# Patient Record
Sex: Female | Born: 1990 | Race: Black or African American | Hispanic: No | Marital: Single | Smoking: Never smoker
Health system: Southern US, Community
[De-identification: ages and names within clinical notes are randomized; demographics above are authoritative.]

## PROBLEM LIST (undated history)

## (undated) ENCOUNTER — Emergency Department (HOSPITAL_COMMUNITY): Admission: EM

## (undated) ENCOUNTER — Inpatient Hospital Stay (HOSPITAL_COMMUNITY): Payer: Self-pay

## (undated) DIAGNOSIS — IMO0001 Reserved for inherently not codable concepts without codable children: Secondary | ICD-10-CM

## (undated) DIAGNOSIS — J302 Other seasonal allergic rhinitis: Secondary | ICD-10-CM

## (undated) HISTORY — DX: Reserved for inherently not codable concepts without codable children: IMO0001

## (undated) HISTORY — DX: Other seasonal allergic rhinitis: J30.2

---
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
## 2011-06-22 ENCOUNTER — Encounter (HOSPITAL_COMMUNITY): Payer: Self-pay | Admitting: *Deleted

## 2011-06-22 ENCOUNTER — Emergency Department (HOSPITAL_COMMUNITY)
Admission: EM | Admit: 2011-06-22 | Discharge: 2011-06-22 | Disposition: A | Discharge status: Home / Self Care | Payer: Self-pay | Attending: Emergency Medicine | Admitting: Emergency Medicine

## 2011-06-22 DIAGNOSIS — J029 Acute pharyngitis, unspecified: Secondary | ICD-10-CM | POA: Insufficient documentation

## 2011-06-22 DIAGNOSIS — J45909 Unspecified asthma, uncomplicated: Secondary | ICD-10-CM | POA: Insufficient documentation

## 2011-06-22 LAB — RAPID STREP SCREEN (MED CTR MEBANE ONLY): Streptococcus, Group A Screen (Direct): NEGATIVE

## 2011-06-22 MED ORDER — ACETAMINOPHEN-CODEINE 120-12 MG/5ML PO SOLN: 10.0000 mL | ORAL | Status: AC | PRN

## 2011-06-22 MED ORDER — AMOXICILLIN-POT CLAVULANATE 875-125 MG PO TABS: 1.0000 | ORAL_TABLET | Freq: Two times a day (BID) | ORAL | Status: AC

## 2011-06-22 NOTE — ED Notes (Signed)
Pt reports ear pain and pain with swallowing since Wednesday. Ibuprofen at home, fever this am.

## 2011-06-22 NOTE — ED Provider Notes (Signed)
History     CSN: [id number]  Arrival date & time 06/22/11  1532   First MD Initiated Contact with Patient 06/22/11 1720      Chief Complaint  Patient presents with  . Otalgia    (Consider location/radiation/quality/duration/timing/severity/associated sxs/prior treatment) HPI Patient presents emergency department with a three-day history of sore throat with pain that radiates to her left ear.  Patient denies nausea, vomiting, diarrhea, abdominal pain, neck swelling or fever.  Patient, states she does have pain with swallowing.  Patient denies take any medications prior to arrival.  Past Medical History  Diagnosis Date  . Asthma     controlled on symbicort, never hospitalized, never intubated  . Seasonal allergies     on allegra, nasonex  . Birth control     on tri-norinyl    History reviewed. No pertinent past surgical history.  Family History  Problem Relation Age of Onset  . Hypertension Mother   . Asthma    . Allergies      History  Substance Use Topics  . Smoking status: Never Smoker   . Smokeless tobacco: Never Used  . Alcohol Use: No    OB History    Grav Para Term Preterm Abortions TAB SAB Ect Mult Living                  Review of Systems All other systems negative except as documented in the HPI. All pertinent positives and negatives as reviewed in the HPI.  Allergies  Review of patient's allergies indicates no known allergies.  Home Medications   Current Outpatient Rx  Name Route Sig Dispense Refill  . ALBUTEROL SULFATE HFA 108 (90 BASE) MCG/ACT IN AERS Inhalation Inhale 2 puffs into the lungs every 6 (six) hours as needed. For shortness of breath    . ALBUTEROL SULFATE (5 MG/ML) 0.5% IN NEBU Nebulization Take 2.5 mg by nebulization every 6 (six) hours as needed. For shortness of breath    . BUDESONIDE-FORMOTEROL FUMARATE 160-4.5 MCG/ACT IN AERO Inhalation Inhale 2 puffs into the lungs 2 (two) times daily.      Marland Kitchen FEXOFENADINE-PSEUDOEPHED ER  180-240 MG PO TB24 Oral Take 1 tablet by mouth daily.    . MOMETASONE FUROATE 50 MCG/ACT NA SUSP Nasal Place 2 sprays into the nose daily.      Kathrynn Running ESTRAD TRIPHASIC 0.5/1/0.5-35 MG-MCG PO TABS Oral Take 1 tablet by mouth daily.        BP 129/76  Pulse 85  Temp 98.8 F (37.1 C)  Resp 20  SpO2 100%  LMP 06/10/2011  Physical Exam  Nursing note and vitals reviewed. Constitutional: She appears well-developed and well-nourished.  HENT:  Head: No trismus in the jaw.  Right Ear: Tympanic membrane normal.  Left Ear: Tympanic membrane normal.  Nose: Nose normal.  Mouth/Throat: Uvula is midline and mucous membranes are normal. No uvula swelling. Oropharyngeal exudate and posterior oropharyngeal edema present. No tonsillar abscesses.  Eyes: Pupils are equal, round, and reactive to light.  Neck: Normal range of motion. Neck supple.  Cardiovascular: Normal rate, regular rhythm and normal heart sounds.   No murmur heard. Pulmonary/Chest: Effort normal and breath sounds normal. No respiratory distress. She has no wheezes.  Lymphadenopathy:    She has cervical adenopathy.  Skin: Skin is warm and dry. No rash noted.    ED Course  Procedures (including critical care time)  Patient will be treated for exudative pharyngitis. The patient is advised to return here as needed. Follow  up with her doctor. Increase her fluids.  MDM          Carlyle Dolly, PA-C 06/22/11 1824

## 2011-06-22 NOTE — ED Provider Notes (Signed)
Medical screening examination/treatment/procedure(s) were performed by non-physician practitioner and as supervising physician I was immediately available for consultation/collaboration.   Dayton Bailiff, MD 06/22/11 [phone]

## 2011-06-22 NOTE — Discharge Instructions (Signed)
Return here as needed. Follow up with your doctor. Increase your fluid intake. °

## 2011-11-26 ENCOUNTER — Other Ambulatory Visit: Payer: Self-pay | Admitting: *Deleted

## 2011-11-27 MED ORDER — ALBUTEROL SULFATE HFA 108 (90 BASE) MCG/ACT IN AERS: 2.0000 | INHALATION_SPRAY | Freq: Four times a day (QID) | RESPIRATORY_TRACT | Status: DC | PRN

## 2011-11-27 NOTE — Telephone Encounter (Signed)
I will refill this but she needs to be seen soon in clinic.   Thanks,  Dr. Dorise Hiss

## 2011-11-27 NOTE — Telephone Encounter (Signed)
Sent flag to front desk pool to make appt per Dr Dorise Hiss.

## 2012-06-06 ENCOUNTER — Encounter: Payer: Self-pay | Admitting: Internal Medicine

## 2012-08-06 ENCOUNTER — Encounter (HOSPITAL_COMMUNITY): Payer: Self-pay | Admitting: Emergency Medicine

## 2012-08-06 ENCOUNTER — Emergency Department (HOSPITAL_COMMUNITY)
Admission: EM | Admit: 2012-08-06 | Discharge: 2012-08-06 | Disposition: A | Discharge status: Home / Self Care | Payer: BC Managed Care – PPO | Attending: Emergency Medicine | Admitting: Emergency Medicine

## 2012-08-06 DIAGNOSIS — K0381 Cracked tooth: Secondary | ICD-10-CM | POA: Insufficient documentation

## 2012-08-06 DIAGNOSIS — Z79899 Other long term (current) drug therapy: Secondary | ICD-10-CM | POA: Insufficient documentation

## 2012-08-06 DIAGNOSIS — J45909 Unspecified asthma, uncomplicated: Secondary | ICD-10-CM | POA: Insufficient documentation

## 2012-08-06 DIAGNOSIS — IMO0002 Reserved for concepts with insufficient information to code with codable children: Secondary | ICD-10-CM | POA: Insufficient documentation

## 2012-08-06 MED ORDER — OXYCODONE-ACETAMINOPHEN 5-325 MG PO TABS: 2.0000 | ORAL_TABLET | Freq: Four times a day (QID) | ORAL | Status: DC | PRN

## 2012-08-06 MED ORDER — PROMETHAZINE HCL 25 MG PO TABS: 25.0000 mg | ORAL_TABLET | Freq: Four times a day (QID) | ORAL | Status: DC | PRN

## 2012-08-06 NOTE — ED Notes (Signed)
Pt c/o left lower dental pain after cracking tooth this am

## 2012-08-06 NOTE — ED Provider Notes (Signed)
Medical screening examination/treatment/procedure(s) were performed by non-physician practitioner and as supervising physician I was immediately available for consultation/collaboration.   Glynn Octave, MD 08/06/12 1610

## 2012-08-06 NOTE — ED Provider Notes (Signed)
History    CSN: [id number] Arrival date & time 08/06/12  1234  First MD Initiated Contact with Patient 08/06/12 1257     Chief Complaint  Patient presents with  . Dental Pain   (Consider location/radiation/quality/duration/timing/severity/associated sxs/prior Treatment) HPI Comments: 22 year old female who presents today after she broke her tooth earlier this morning eating cereal. She states she is waiting for her cereal to get soggy, but ate cereal too soon. She has had a prior root canal in this tooth in 2012 and has not had any issues since that time. She states her pain began to slowly increase as she has been in the emergency department. It is a pinching pain without radiation. She has not done anything to make her to feel better. She is not sensitive to heat or cold. The pain does not radiate. She denies shortness of breath, fever, chills, nausea, vomiting, abdominal pain, trismus.  The history is provided by the patient. No language interpreter was used.   Past Medical History  Diagnosis Date  . Asthma     controlled on symbicort, never hospitalized, never intubated  . Seasonal allergies     on allegra, nasonex  . Birth control     on tri-norinyl   History reviewed. No pertinent past surgical history. Family History  Problem Relation Age of Onset  . Hypertension Mother   . Asthma    . Allergies     History  Substance Use Topics  . Smoking status: Never Smoker   . Smokeless tobacco: Never Used  . Alcohol Use: No   OB History   Grav Para Term Preterm Abortions TAB SAB Ect Mult Living                 Review of Systems  Constitutional: Negative for fever and chills.  HENT: Positive for dental problem. Negative for sore throat and drooling.   Respiratory: Negative for shortness of breath.   Cardiovascular: Negative for chest pain.  Gastrointestinal: Negative for nausea, vomiting and abdominal pain.    Allergies  Review of patient's allergies indicates no known  allergies.  Home Medications   Current Outpatient Rx  Name  Route  Sig  Dispense  Refill  . albuterol (PROVENTIL HFA;VENTOLIN HFA) 108 (90 BASE) MCG/ACT inhaler   Inhalation   Inhale 2 puffs into the lungs every 6 (six) hours as needed. For shortness of breath   1 Inhaler   0   . albuterol (PROVENTIL) (5 MG/ML) 0.5% nebulizer solution   Nebulization   Take 2.5 mg by nebulization every 6 (six) hours as needed. For shortness of breath         . budesonide-formoterol (SYMBICORT) 160-4.5 MCG/ACT inhaler   Inhalation   Inhale 3 puffs into the lungs 3 (three) times daily.          . fexofenadine-pseudoephedrine (ALLEGRA-D 24) 180-240 MG per 24 hr tablet   Oral   Take 1 tablet by mouth daily.         . fluticasone (FLONASE) 50 MCG/ACT nasal spray   Nasal   Place 2 sprays into the nose daily.         . Norethindrone-Ethinyl Estradiol Triphasic (TRI-NORINYL, 28,) 0.5/1/0.5-35 MG-MCG tablet   Oral   Take 1 tablet by mouth daily.            BP 120/80  Pulse 72  Temp(Src) 98.3 F (36.8 C) (Oral)  Resp 18  Ht 5\' 3"  (1.6 m)  Wt 165 lb (74.844 kg)  BMI 29.24 kg/m2  SpO2 100% Physical Exam  Nursing note and vitals reviewed. Constitutional: She is oriented to person, place, and time. She appears well-developed and well-nourished. No distress.  HENT:  Head: Normocephalic and atraumatic.  Right Ear: External ear normal.  Left Ear: External ear normal.  Nose: Nose normal.  Mouth/Throat: Uvula is midline, oropharynx is clear and moist and mucous membranes are normal.    Fillings in most molars.  Lower right molar cracked with missing medial aspect of tooth. Missing filling.  No trismus, submental edema, tongue elevation  Eyes: Conjunctivae are normal.  Neck: Normal range of motion.  Cardiovascular: Normal rate, regular rhythm and normal heart sounds.   Pulmonary/Chest: Effort normal and breath sounds normal. No stridor. No respiratory distress. She has no wheezes. She  has no rales.  Abdominal: Soft. She exhibits no distension.  Musculoskeletal: Normal range of motion.  Neurological: She is alert and oriented to person, place, and time. She has normal strength.  Skin: Skin is warm and dry. She is not diaphoretic. No erythema.  Psychiatric: She has a normal mood and affect. Her behavior is normal.    ED Course  Procedures (including critical care time) Labs Reviewed - No data to display No results found. 1. Broken or cracked tooth, nontraumatic     MDM  Patient with broken tooth.  No gross abscess.  Exam unconcerning for Ludwig's angina or spread of infection.  Will treat with pain medicine.  Patient has a dentist and follow up appointment. Urged patient to call and be put on the cancellation list. Gave her referral to [facility].    Mora Bellman, PA-C 08/06/12 [phone]

## 2012-08-06 NOTE — ED Notes (Signed)
Patient states she was eating cereal this morning and cracked one of her teeth.   Patient states she is having no pain at this time.  Patient states she did call dentist and unable to get an appointment until August.

## 2014-08-19 ENCOUNTER — Emergency Department (HOSPITAL_COMMUNITY)
Admission: EM | Admit: 2014-08-19 | Discharge: 2014-08-19 | Disposition: A | Discharge status: Home / Self Care | Payer: 59 | Attending: Emergency Medicine | Admitting: Emergency Medicine

## 2014-08-19 ENCOUNTER — Encounter (HOSPITAL_COMMUNITY): Payer: Self-pay | Admitting: Emergency Medicine

## 2014-08-19 DIAGNOSIS — Z3202 Encounter for pregnancy test, result negative: Secondary | ICD-10-CM | POA: Diagnosis not present

## 2014-08-19 DIAGNOSIS — R112 Nausea with vomiting, unspecified: Secondary | ICD-10-CM | POA: Insufficient documentation

## 2014-08-19 DIAGNOSIS — Z7951 Long term (current) use of inhaled steroids: Secondary | ICD-10-CM | POA: Insufficient documentation

## 2014-08-19 DIAGNOSIS — J45909 Unspecified asthma, uncomplicated: Secondary | ICD-10-CM | POA: Diagnosis not present

## 2014-08-19 DIAGNOSIS — Z79899 Other long term (current) drug therapy: Secondary | ICD-10-CM | POA: Diagnosis not present

## 2014-08-19 DIAGNOSIS — Z79818 Long term (current) use of other agents affecting estrogen receptors and estrogen levels: Secondary | ICD-10-CM | POA: Insufficient documentation

## 2014-08-19 DIAGNOSIS — N390 Urinary tract infection, site not specified: Secondary | ICD-10-CM | POA: Diagnosis not present

## 2014-08-19 LAB — COMPREHENSIVE METABOLIC PANEL
ALT: 11 U/L — ABNORMAL LOW (ref 14–54)
AST: 16 U/L (ref 15–41)
Albumin: 3.3 g/dL — ABNORMAL LOW (ref 3.5–5.0)
Alkaline Phosphatase: 53 U/L (ref 38–126)
Anion gap: 6 (ref 5–15)
BUN: 13 mg/dL (ref 6–20)
CO2: 25 mmol/L (ref 22–32)
Calcium: 8.6 mg/dL — ABNORMAL LOW (ref 8.9–10.3)
Chloride: 106 mmol/L (ref 101–111)
Creatinine, Ser: 0.72 mg/dL (ref 0.44–1.00)
GFR calc Af Amer: >60 mL/min (ref >60)
GFR calc non Af Amer: >60 mL/min (ref >60)
Glucose, Bld: 109 mg/dL — ABNORMAL HIGH (ref 65–99)
Potassium: 3.9 mmol/L (ref 3.5–5.1)
Sodium: 137 mmol/L (ref 135–145)
Total Bilirubin: 0.3 mg/dL (ref 0.3–1.2)
Total Protein: 7.9 g/dL (ref 6.5–8.1)

## 2014-08-19 LAB — HCG, QUANTITATIVE, PREGNANCY: hCG, Beta Chain, Quant, S: <1 m[IU]/mL (ref <5)

## 2014-08-19 LAB — CBC
HCT: 38.1 % (ref 36.0–46.0)
Hemoglobin: 12.4 g/dL (ref 12.0–15.0)
MCH: 28.2 pg (ref 26.0–34.0)
MCHC: 32.5 g/dL (ref 30.0–36.0)
MCV: 86.6 fL (ref 78.0–100.0)
Platelets: 248 10*3/uL (ref 150–400)
RBC: 4.4 MIL/uL (ref 3.87–5.11)
RDW: 14.1 % (ref 11.5–15.5)
WBC: 8.3 10*3/uL (ref 4.0–10.5)

## 2014-08-19 LAB — URINALYSIS, ROUTINE W REFLEX MICROSCOPIC
Bilirubin Urine: NEGATIVE
Glucose, UA: NEGATIVE mg/dL
Hgb urine dipstick: NEGATIVE
Ketones, ur: NEGATIVE mg/dL
Leukocytes, UA: NEGATIVE
Nitrite: POSITIVE — AB
Protein, ur: NEGATIVE mg/dL
Specific Gravity, Urine: 1.009 (ref 1.005–1.030)
Urobilinogen, UA: 1 mg/dL (ref 0.0–1.0)
pH: 6.5 (ref 5.0–8.0)

## 2014-08-19 LAB — URINE MICROSCOPIC-ADD ON

## 2014-08-19 MED ORDER — CEPHALEXIN 500 MG PO CAPS
500.0000 mg | ORAL_CAPSULE | Freq: Four times a day (QID) | ORAL | Status: DC
Start: 2014-08-19 — End: 2015-09-29

## 2014-08-19 MED ORDER — ONDANSETRON HCL 4 MG PO TABS: 4.0000 mg | ORAL_TABLET | Freq: Four times a day (QID) | ORAL | Status: DC

## 2014-08-19 MED ORDER — ONDANSETRON 4 MG PO TBDP
4.0000 mg | ORAL_TABLET | Freq: Once | ORAL | Status: AC | PRN
  Administered 2014-08-19: 4 mg via ORAL
  Filled 2014-08-19: qty 1

## 2014-08-19 NOTE — Discharge Instructions (Signed)
Nausea and Vomiting °Nausea means you feel sick to your stomach. Throwing up (vomiting) is a reflex where stomach contents come out of your mouth. °HOME CARE  °· Take medicine as told by your doctor. °· Do not force yourself to eat. However, you do need to drink fluids. °· If you feel like eating, eat a normal diet as told by your doctor. °· Eat rice, wheat, potatoes, bread, lean meats, yogurt, fruits, and vegetables. °· Avoid high-fat foods. °· Drink enough fluids to keep your pee (urine) clear or pale yellow. °· Ask your doctor how to replace body fluid losses (rehydrate). Signs of body fluid loss (dehydration) include: °· Feeling very thirsty. °· Dry lips and mouth. °· Feeling dizzy. °· Dark pee. °· Peeing less than normal. °· Feeling confused. °· Fast breathing or heart rate. °GET HELP RIGHT AWAY IF:  °· You have blood in your throw up. °· You have black or bloody poop (stool). °· You have a bad headache or stiff neck. °· You feel confused. °· You have bad belly (abdominal) pain. °· You have chest pain or trouble breathing. °· You do not pee at least once every 8 hours. °· You have cold, clammy skin. °· You keep throwing up after 24 to 48 hours. °· You have a fever. °MAKE SURE YOU:  °· Understand these instructions. °· Will watch your condition. °· Will get help right away if you are not doing well or get worse. °Document Released: 06/20/2007 Document Revised: 03/26/2011 Document Reviewed: 06/02/2010 °ExitCare® Patient Information ©2015 [employer]. This information is not intended to replace advice given to you by your health care provider. Make sure you discuss any questions you have with your health care provider. ° °Urinary Tract Infection °Urinary tract infections (UTIs) can develop anywhere along your urinary tract. Your urinary tract is your body's drainage system for removing wastes and extra water. Your urinary tract includes two kidneys, two ureters, a bladder, and a urethra. Your kidneys are a pair  of bean-shaped organs. Each kidney is about the size of your fist. They are located below your ribs, one on each side of your spine. °CAUSES °Infections are caused by microbes, which are microscopic organisms, including fungi, viruses, and bacteria. These organisms are so small that they can only be seen through a microscope. Bacteria are the microbes that most commonly cause UTIs. °SYMPTOMS  °Symptoms of UTIs may vary by age and gender of the patient and by the location of the infection. Symptoms in young women typically include a frequent and intense urge to urinate and a painful, burning feeling in the bladder or urethra during urination. Older women and men are more likely to be tired, shaky, and weak and have muscle aches and abdominal pain. A fever may mean the infection is in your kidneys. Other symptoms of a kidney infection include pain in your back or sides below the ribs, nausea, and vomiting. °DIAGNOSIS °To diagnose a UTI, your caregiver will ask you about your symptoms. Your caregiver also will ask to provide a urine sample. The urine sample will be tested for bacteria and white blood cells. White blood cells are made by your body to help fight infection. °TREATMENT  °Typically, UTIs can be treated with medication. Because most UTIs are caused by a bacterial infection, they usually can be treated with the use of antibiotics. The choice of antibiotic and length of treatment depend on your symptoms and the type of bacteria causing your infection. °HOME CARE INSTRUCTIONS °·   If you were prescribed antibiotics, take them exactly as your caregiver instructs you. Finish the medication even if you feel better after you have only taken some of the medication. °· Drink enough water and fluids to keep your urine clear or pale yellow. °· Avoid caffeine, tea, and carbonated beverages. They tend to irritate your bladder. °· Empty your bladder often. Avoid holding urine for long periods of time. °· Empty your bladder  before and after sexual intercourse. °· After a bowel movement, women should cleanse from front to back. Use each tissue only once. °SEEK MEDICAL CARE IF:  °· You have back pain. °· You develop a fever. °· Your symptoms do not begin to resolve within 3 days. °SEEK IMMEDIATE MEDICAL CARE IF:  °· You have severe back pain or lower abdominal pain. °· You develop chills. °· You have nausea or vomiting. °· You have continued burning or discomfort with urination. °MAKE SURE YOU:  °· Understand these instructions. °· Will watch your condition. °· Will get help right away if you are not doing well or get worse. °Document Released: 10/11/2004 Document Revised: 07/03/2011 Document Reviewed: 02/09/2011 °ExitCare® Patient Information ©2015 [employer]. This information is not intended to replace advice given to you by your health care provider. Make sure you discuss any questions you have with your health care provider. ° °

## 2014-08-19 NOTE — ED Notes (Signed)
Pt states she has had nausea since Thursday   Pt states she ate at [facility] and her cole slaw had mold in it  Pt states she has not had vomiting or diarrhea  Pt states she has not eaten anything on Wednesday but has been taking fluids

## 2014-08-19 NOTE — ED Provider Notes (Signed)
CSN: [id number]     Arrival date & time 08/19/14  0057 History   First MD Initiated Contact with Patient 08/19/14 0115     Chief Complaint  Patient presents with  . Nausea     (Consider location/radiation/quality/duration/timing/severity/associated sxs/prior Treatment) HPI Comments: The patient presents with c/o "I don't feel well". She has had nausea and vomiting x 8 in 3 days. No fever. No diarrhea. She feels she may have eaten food that was "moldy" and this is contributing to her symptoms. No other family members affected. She has cramping abdominal discomfort but no significant abdominal pain.   The history is provided by the patient. No language interpreter was used.    Past Medical History  Diagnosis Date  . Asthma     controlled on symbicort, never hospitalized, never intubated  . Seasonal allergies     on allegra, nasonex  . Birth control     on tri-norinyl   History reviewed. No pertinent past surgical history. Family History  Problem Relation Age of Onset  . Hypertension Mother   . Asthma    . Allergies     History  Substance Use Topics  . Smoking status: Never Smoker   . Smokeless tobacco: Never Used  . Alcohol Use: No   OB History    No data available     Review of Systems  Constitutional: Negative for fever and chills.  Gastrointestinal: Positive for nausea and vomiting. Negative for blood in stool.  Genitourinary: Negative.   Musculoskeletal: Negative.  Negative for myalgias.  Skin: Negative.   Neurological: Negative.       Allergies  Aleve and Ibuprofen  Home Medications   Prior to Admission medications   Medication Sig Start Date End Date Taking? Authorizing Provider  albuterol (PROVENTIL HFA;VENTOLIN HFA) 108 (90 BASE) MCG/ACT inhaler Inhale 2 puffs into the lungs every 6 (six) hours as needed. For shortness of breath 11/26/11   Judie Bonus, MD  albuterol (PROVENTIL) (5 MG/ML) 0.5% nebulizer solution Take 2.5 mg by nebulization every  6 (six) hours as needed. For shortness of breath    Historical Provider, MD  budesonide-formoterol (SYMBICORT) 160-4.5 MCG/ACT inhaler Inhale 3 puffs into the lungs 3 (three) times daily.     Historical Provider, MD  fexofenadine-pseudoephedrine (ALLEGRA-D 24) 180-240 MG per 24 hr tablet Take 1 tablet by mouth daily.    Historical Provider, MD  fluticasone (FLONASE) 50 MCG/ACT nasal spray Place 2 sprays into the nose daily.    Historical Provider, MD  Norethindrone-Ethinyl Estradiol Triphasic (TRI-NORINYL, 28,) 0.5/1/0.5-35 MG-MCG tablet Take 1 tablet by mouth daily.      Historical Provider, MD  oxyCODONE-acetaminophen (PERCOCET/ROXICET) 5-325 MG per tablet Take 2 tablets by mouth every 6 (six) hours as needed for pain. 08/06/12   Junious Silk, PA-C  promethazine (PHENERGAN) 25 MG tablet Take 1 tablet (25 mg total) by mouth every 6 (six) hours as needed for nausea. 08/06/12   Junious Silk, PA-C   BP 138/75 mmHg  Pulse 77  Temp(Src) 98.8 F (37.1 C) (Oral)  Resp 18  Ht 5\' 3"  (1.6 m)  Wt 225 lb (102.059 kg)  BMI 39.87 kg/m2  SpO2 100%  LMP 08/11/2014 (Exact Date) Physical Exam  Constitutional: She is oriented to person, place, and time. She appears well-developed and well-nourished.  Smiling, in NAD.  HENT:  Head: Normocephalic.  Mouth/Throat: Mucous membranes are dry.  Neck: Normal range of motion. Neck supple.  Cardiovascular: Normal rate and regular rhythm.   Pulmonary/Chest:  Effort normal and breath sounds normal. She has no wheezes. She has no rales. She exhibits no tenderness.  Abdominal: Soft. Bowel sounds are normal. She exhibits no distension. There is no tenderness. There is no rebound and no guarding.  Musculoskeletal: Normal range of motion.  Neurological: She is alert and oriented to person, place, and time.  Skin: Skin is warm and dry. No rash noted.  Psychiatric: She has a normal mood and affect.    ED Course  Procedures (including critical care time) Labs  Review Labs Reviewed  COMPREHENSIVE METABOLIC PANEL - Abnormal; Notable for the following:    Glucose, Bld 109 (*)    Calcium 8.6 (*)    Albumin 3.3 (*)    ALT 11 (*)    All other components within normal limits  URINALYSIS, ROUTINE W REFLEX MICROSCOPIC (NOT AT [facility]) - Abnormal; Notable for the following:    APPearance CLOUDY (*)    Nitrite POSITIVE (*)    All other components within normal limits  URINE MICROSCOPIC-ADD ON - Abnormal; Notable for the following:    Squamous Epithelial / LPF MANY (*)    Bacteria, UA MANY (*)    All other components within normal limits  CBC  HCG, QUANTITATIVE, PREGNANCY    Imaging Review No results found.   EKG Interpretation None      MDM   Final diagnoses:  None    1. Nausea and vomiting 2. Nitrite positive urine  The patient has no vomiting in the emergency department. She is tolerating PO fluids without difficulty. She reports being hungry on re-evaluation.  She denies urinary symptoms but does have a nitrite positive urine. Will treat with abx and encourage PCP follow up.     Elpidio Anis, PA-C 08/19/14 [id number]  Marisa Severin, MD 08/19/14 0530

## 2014-12-30 ENCOUNTER — Emergency Department (HOSPITAL_COMMUNITY): Payer: 59

## 2014-12-30 ENCOUNTER — Encounter (HOSPITAL_COMMUNITY): Payer: Self-pay | Admitting: Emergency Medicine

## 2014-12-30 ENCOUNTER — Emergency Department (HOSPITAL_COMMUNITY)
Admission: EM | Admit: 2014-12-30 | Discharge: 2014-12-30 | Disposition: A | Discharge status: Home / Self Care | Payer: 59 | Attending: Emergency Medicine | Admitting: Emergency Medicine

## 2014-12-30 DIAGNOSIS — Z793 Long term (current) use of hormonal contraceptives: Secondary | ICD-10-CM | POA: Diagnosis not present

## 2014-12-30 DIAGNOSIS — H9203 Otalgia, bilateral: Secondary | ICD-10-CM | POA: Diagnosis not present

## 2014-12-30 DIAGNOSIS — J45909 Unspecified asthma, uncomplicated: Secondary | ICD-10-CM | POA: Diagnosis not present

## 2014-12-30 DIAGNOSIS — Z7951 Long term (current) use of inhaled steroids: Secondary | ICD-10-CM | POA: Insufficient documentation

## 2014-12-30 DIAGNOSIS — K0889 Other specified disorders of teeth and supporting structures: Secondary | ICD-10-CM | POA: Diagnosis not present

## 2014-12-30 DIAGNOSIS — J069 Acute upper respiratory infection, unspecified: Secondary | ICD-10-CM | POA: Insufficient documentation

## 2014-12-30 DIAGNOSIS — Z792 Long term (current) use of antibiotics: Secondary | ICD-10-CM | POA: Diagnosis not present

## 2014-12-30 DIAGNOSIS — J029 Acute pharyngitis, unspecified: Secondary | ICD-10-CM | POA: Diagnosis present

## 2014-12-30 DIAGNOSIS — Z79899 Other long term (current) drug therapy: Secondary | ICD-10-CM | POA: Diagnosis not present

## 2014-12-30 MED ORDER — BENZONATATE 100 MG PO CAPS
100.0000 mg | ORAL_CAPSULE | Freq: Once | ORAL | Status: AC
  Administered 2014-12-30: 100 mg via ORAL
  Filled 2014-12-30: qty 1

## 2014-12-30 MED ORDER — BENZONATATE 100 MG PO CAPS: 100.0000 mg | ORAL_CAPSULE | Freq: Three times a day (TID) | ORAL | Status: DC

## 2014-12-30 NOTE — Progress Notes (Addendum)
Per pts friend. He stated,'she can not talk. She has a sore throat and is hoarse. She missed work yesterday and today and needs a work note. " Pt c/o of a bad tooth also. Pt does not appear to be in any distress. Tongue is not swollen and pt is not drooling. 3:45p Lung sounds clear in all fields. Pt taken for a CXR in the dept. 4p -returned from the x-ray dept. Tolerated well. Pt was given an ice pack to apply to her neck for slight discomfort.

## 2014-12-30 NOTE — Discharge Instructions (Signed)
1. Medications: tessalon, usual home medications 2. Treatment: rest, drink plenty of fluids 3. Follow Up: please followup with your primary doctor for discussion of your diagnoses and further evaluation after today's visit; if you do not have a primary care doctor use the resource guide provided to find one; please return to the ER for high fever, chest pain, shortness of breath, new or worsening symptoms   Upper Respiratory Infection, Adult Most upper respiratory infections (URIs) are a viral infection of the air passages leading to the lungs. A URI affects the nose, throat, and upper air passages. The most common type of URI is nasopharyngitis and is typically referred to as "the common cold." URIs run their course and usually go away on their own. Most of the time, a URI does not require medical attention, but sometimes a bacterial infection in the upper airways can follow a viral infection. This is called a secondary infection. Sinus and middle ear infections are common types of secondary upper respiratory infections. Bacterial pneumonia can also complicate a URI. A URI can worsen asthma and chronic obstructive pulmonary disease (COPD). Sometimes, these complications can require emergency medical care and may be life threatening.  CAUSES Almost all URIs are caused by viruses. A virus is a type of germ and can spread from one person to another.  RISKS FACTORS You may be at risk for a URI if:   You smoke.   You have chronic heart or lung disease.  You have a weakened defense (immune) system.   You are very young or very old.   You have nasal allergies or asthma.  You work in crowded or poorly ventilated areas.  You work in health care facilities or schools. SIGNS AND SYMPTOMS  Symptoms typically develop 2-3 days after you come in contact with a cold virus. Most viral URIs last 7-10 days. However, viral URIs from the influenza virus (flu virus) can last 14-18 days and are typically  more severe. Symptoms may include:   Runny or stuffy (congested) nose.   Sneezing.   Cough.   Sore throat.   Headache.   Fatigue.   Fever.   Loss of appetite.   Pain in your forehead, behind your eyes, and over your cheekbones (sinus pain).  Muscle aches.  DIAGNOSIS  Your health care provider may diagnose a URI by:  Physical exam.  Tests to check that your symptoms are not due to another condition such as:  Strep throat.  Sinusitis.  Pneumonia.  Asthma. TREATMENT  A URI goes away on its own with time. It cannot be cured with medicines, but medicines may be prescribed or recommended to relieve symptoms. Medicines may help:  Reduce your fever.  Reduce your cough.  Relieve nasal congestion. HOME CARE INSTRUCTIONS   Take medicines only as directed by your health care provider.   Gargle warm saltwater or take cough drops to comfort your throat as directed by your health care provider.  Use a warm mist humidifier or inhale steam from a shower to increase air moisture. This may make it easier to breathe.  Drink enough fluid to keep your urine clear or pale yellow.   Eat soups and other clear broths and maintain good nutrition.   Rest as needed.   Return to work when your temperature has returned to normal or as your health care provider advises. You may need to stay home longer to avoid infecting others. You can also use a face mask and careful hand washing to  prevent spread of the virus.  Increase the usage of your inhaler if you have asthma.   Do not use any tobacco products, including cigarettes, chewing tobacco, or electronic cigarettes. If you need help quitting, ask your health care provider. PREVENTION  The best way to protect yourself from getting a cold is to practice good hygiene.   Avoid oral or hand contact with people with cold symptoms.   Wash your hands often if contact occurs.  There is no clear evidence that vitamin C,  vitamin E, echinacea, or exercise reduces the chance of developing a cold. However, it is always recommended to get plenty of rest, exercise, and practice good nutrition.  SEEK MEDICAL CARE IF:   You are getting worse rather than better.   Your symptoms are not controlled by medicine.   You have chills.  You have worsening shortness of breath.  You have brown or red mucus.  You have yellow or brown nasal discharge.  You have pain in your face, especially when you bend forward.  You have a fever.  You have swollen neck glands.  You have pain while swallowing.  You have white areas in the back of your throat. SEEK IMMEDIATE MEDICAL CARE IF:   You have severe or persistent:  Headache.  Ear pain.  Sinus pain.  Chest pain.  You have chronic lung disease and any of the following:  Wheezing.  Prolonged cough.  Coughing up blood.  A change in your usual mucus.  You have a stiff neck.  You have changes in your:  Vision.  Hearing.  Thinking.  Mood. MAKE SURE YOU:   Understand these instructions.  Will watch your condition.  Will get help right away if you are not doing well or get worse.   This information is not intended to replace advice given to you by your health care provider. Make sure you discuss any questions you have with your health care provider.   Document Released: 06/27/2000 Document Revised: 05/18/2014 Document Reviewed: 04/08/2013 Elsevier Interactive Patient Education [employer]2016 Elsevier Inc.

## 2014-12-30 NOTE — ED Notes (Signed)
Pt began with a runny nose, cough, sore throat. Yesterday complained of occasional stomach pain, boyfriend says she vomited once. Patient says today her stomach no longer hurts, she does not feel nauseous. Only complaining of ear pain, runny nose, cough, sore throat, loss of voice.

## 2014-12-30 NOTE — ED Provider Notes (Signed)
CSN: [id number]     Arrival date & time 12/30/14  1514 History  By signing my name below, I, Joy Powers, attest that this documentation has been prepared under the direction and in the presence of Glean Hess, PA-C Electronically Signed: Charline Bills, ED Scribe 12/30/2014 at 4:19 PM.   Chief Complaint  Patient presents with  . Hoarse  . Nasal Congestion  . Cough  . Sore Throat    The history is provided by the patient. No language interpreter was used.    HPI Comments: Joy Powers is a 24 y.o. female, with a h/o asthma, who presents to the Emergency Department complaining of intermittent hoarseness since yesterday. Pt states that she has had nasal congestion and productive cough with green/yellow sputum for 3 weeks. She reports associated bilateral ear pain, intermittent left upper dental pain, and sore throat that has resolved. Pt has tried tylenol and her albuterol inhaler this morning with temporary relief. Pt also reports abdominal pain with 1 episode of emesis yesterday morning that has resolved. She denies SOB and chest pain.   Past Medical History  Diagnosis Date  . Asthma     controlled on symbicort, never hospitalized, never intubated  . Seasonal allergies     on allegra, nasonex  . Birth control     on tri-norinyl   History reviewed. No pertinent past surgical history. Family History  Problem Relation Age of Onset  . Hypertension Mother   . Asthma    . Allergies     Social History  Substance Use Topics  . Smoking status: Never Smoker   . Smokeless tobacco: Never Used  . Alcohol Use: No   OB History    No data available      Review of Systems  Constitutional: Negative for fever.  HENT: Positive for congestion, dental problem (resolved), ear pain, sore throat (resolved) and voice change.   Respiratory: Positive for cough. Negative for shortness of breath.   Cardiovascular: Negative for chest pain.  Gastrointestinal: Positive for vomiting  (resolved) and abdominal pain (resolved).    Allergies  Aleve and Ibuprofen  Home Medications   Prior to Admission medications   Medication Sig Start Date End Date Taking? Authorizing Provider  albuterol (PROVENTIL HFA;VENTOLIN HFA) 108 (90 BASE) MCG/ACT inhaler Inhale 2 puffs into the lungs every 6 (six) hours as needed. For shortness of breath 11/26/11   Myrlene Broker, MD  albuterol (PROVENTIL) (5 MG/ML) 0.5% nebulizer solution Take 2.5 mg by nebulization every 6 (six) hours as needed. For shortness of breath    Historical Provider, MD  benzonatate (TESSALON) 100 MG capsule Take 1 capsule (100 mg total) by mouth every 8 (eight) hours. 12/30/14   Mady Gemma, PA-C  budesonide-formoterol [facility]) 160-4.5 MCG/ACT inhaler Inhale 3 puffs into the lungs 3 (three) times daily.     Historical Provider, MD  cephALEXin (KEFLEX) 500 MG capsule Take 1 capsule (500 mg total) by mouth 4 (four) times daily. 08/19/14   Elpidio Anis, PA-C  fexofenadine-pseudoephedrine (ALLEGRA-D 24) 180-240 MG per 24 hr tablet Take 1 tablet by mouth daily.    Historical Provider, MD  fluticasone (FLONASE) 50 MCG/ACT nasal spray Place 2 sprays into the nose daily.    Historical Provider, MD  Norethindrone-Ethinyl Estradiol Triphasic (TRI-NORINYL, 28,) 0.5/1/0.5-35 MG-MCG tablet Take 1 tablet by mouth daily.      Historical Provider, MD  ondansetron (ZOFRAN) 4 MG tablet Take 1 tablet (4 mg total) by mouth every 6 (six) hours. 08/19/14  Elpidio AnisShari Upstill, PA-C  oxyCODONE-acetaminophen (PERCOCET/ROXICET) 5-325 MG per tablet Take 2 tablets by mouth every 6 (six) hours as needed for pain. 08/06/12   Junious SilkHannah Merrell, PA-C  promethazine (PHENERGAN) 25 MG tablet Take 1 tablet (25 mg total) by mouth every 6 (six) hours as needed for nausea. 08/06/12   Junious SilkHannah Merrell, PA-C    BP 124/75 mmHg  Pulse 76  Temp(Src) 98.1 F (36.7 C) (Oral)  Resp 18  SpO2 100% Physical Exam  Constitutional: She is oriented to person, place,  and time. She appears well-developed and well-nourished. No distress.  HENT:  Head: Normocephalic and atraumatic.  Right Ear: Hearing, tympanic membrane, external ear and ear canal normal.  Left Ear: Hearing, tympanic membrane, external ear and ear canal normal.  Nose: Nose normal.  Mouth/Throat: Uvula is midline, oropharynx is clear and moist and mucous membranes are normal. No oropharyngeal exudate, posterior oropharyngeal edema, posterior oropharyngeal erythema or tonsillar abscesses.  No TTP, edema, erythema, abscess to left upper teeth. No swelling to floor of mouth.  Eyes: Conjunctivae and EOM are normal. Pupils are equal, round, and reactive to light. Right eye exhibits no discharge. Left eye exhibits no discharge. No scleral icterus.  Neck: Normal range of motion. Neck supple.  Cardiovascular: Normal rate, regular rhythm, normal heart sounds and intact distal pulses.   Pulmonary/Chest: Effort normal and breath sounds normal. No respiratory distress. She has no wheezes. She has no rales.  Abdominal: Soft. Bowel sounds are normal. She exhibits no distension and no mass. There is no tenderness. There is no rebound and no guarding.  Musculoskeletal: Normal range of motion. She exhibits no edema or tenderness.  Neurological: She is alert and oriented to person, place, and time.  Skin: Skin is warm and dry. She is not diaphoretic.  Psychiatric: She has a normal mood and affect. Her behavior is normal.  Nursing note and vitals reviewed.  ED Course  Procedures (including critical care time)  DIAGNOSTIC STUDIES: Oxygen Saturation is 100% on RA, normal by my interpretation.    COORDINATION OF CARE: 3:41 PM-Discussed treatment plan which includes CXR and tessalon with pt at bedside and pt agreed to plan.   Labs Review Labs Reviewed - No data to display Imaging Review Dg Chest 2 View  12/30/2014  CLINICAL DATA:  Productive cough, fever. EXAM: CHEST  2 VIEW COMPARISON:  None. FINDINGS:  The heart size and mediastinal contours are within normal limits. Both lungs are clear. No pneumothorax or pleural effusion is noted. The visualized skeletal structures are unremarkable. IMPRESSION: No active cardiopulmonary disease. Electronically Signed   By: Lupita RaiderJames  Green Jr, M.D.   On: 12/30/2014 15:59   I have personally reviewed and evaluated these images as part of my medical decision-making.   EKG Interpretation None      MDM   Final diagnoses:  URI (upper respiratory infection)    24 year old female presents with ear pain, nasal congestion, productive cough, and hoarseness. States her symptoms have been present x 3 weeks. Also reports sore throat, now resolved, as well as abdominal pain yesterday and 1 episode of emesis. Reports subjective fever. Denies chest pain, shortness of breath.  Patient is afebrile. Vital signs stable. TMs clear bilaterally. No nasal congestion. Posterior oropharynx without erythema, edema, or exudate. No signs of dental infection or abscess. No evidence of ludwigs. Heart regular rate and rhythm. Lungs clear to auscultation bilaterally. Abdomen soft, nontender, nondistended.  Will obtain chest x-ray given chronicity of symptoms. Patient given tessalon for  cough. Chest x-ray negative for active cardiopulmonary disease.  Symptoms likely viral. Patient is nontoxic and well-appearing, feel she is stable for discharge at this time. Advised to use warm honey, tea, throat lozenges for symptom relief. Will discharge with tessalon. Patient to follow-up with PCP. Return precautions discussed. Patient verbalizes her understanding and is in agreement with plan.  BP 124/75 mmHg  Pulse 76  Temp(Src) 98.1 F (36.7 C) (Oral)  Resp 18  SpO2 100%  LMP 12/09/2014  I personally performed the services described in this documentation, which was scribed in my presence. The recorded information has been reviewed and is accurate.     Mady Gemma, PA-C 12/30/14  1619  Eber Hong, MD 01/02/15 [phone]

## 2015-01-17 ENCOUNTER — Emergency Department (HOSPITAL_COMMUNITY): Payer: 59

## 2015-01-17 ENCOUNTER — Encounter (HOSPITAL_COMMUNITY): Payer: Self-pay | Admitting: Emergency Medicine

## 2015-01-17 ENCOUNTER — Emergency Department (HOSPITAL_COMMUNITY)
Admission: EM | Admit: 2015-01-17 | Discharge: 2015-01-17 | Disposition: A | Discharge status: Home / Self Care | Payer: 59 | Attending: Emergency Medicine | Admitting: Emergency Medicine

## 2015-01-17 DIAGNOSIS — R05 Cough: Secondary | ICD-10-CM | POA: Diagnosis present

## 2015-01-17 DIAGNOSIS — Z79899 Other long term (current) drug therapy: Secondary | ICD-10-CM | POA: Diagnosis not present

## 2015-01-17 DIAGNOSIS — Z79818 Long term (current) use of other agents affecting estrogen receptors and estrogen levels: Secondary | ICD-10-CM | POA: Diagnosis not present

## 2015-01-17 DIAGNOSIS — Z792 Long term (current) use of antibiotics: Secondary | ICD-10-CM | POA: Diagnosis not present

## 2015-01-17 DIAGNOSIS — J0110 Acute frontal sinusitis, unspecified: Secondary | ICD-10-CM | POA: Diagnosis not present

## 2015-01-17 DIAGNOSIS — R059 Cough, unspecified: Secondary | ICD-10-CM

## 2015-01-17 DIAGNOSIS — Z7951 Long term (current) use of inhaled steroids: Secondary | ICD-10-CM | POA: Diagnosis not present

## 2015-01-17 DIAGNOSIS — J45909 Unspecified asthma, uncomplicated: Secondary | ICD-10-CM | POA: Diagnosis not present

## 2015-01-17 LAB — RAPID STREP SCREEN (MED CTR MEBANE ONLY): Streptococcus, Group A Screen (Direct): NEGATIVE

## 2015-01-17 MED ORDER — AMOXICILLIN-POT CLAVULANATE 875-125 MG PO TABS: 1.0000 | ORAL_TABLET | Freq: Two times a day (BID) | ORAL | Status: DC

## 2015-01-17 NOTE — ED Provider Notes (Signed)
CSN: [id number]     Arrival date & time 01/17/15  0914 History   First MD Initiated [id number] with Patient 01/17/15 1012     Chief Complaint  Patient presents with  . URI     (Consider location/radiation/quality/duration/timing/severity/associated sxs/prior Treatment) Patient is a 25 y.o. female presenting with URI. The history is provided by the patient and medical records. No language interpreter was used.  URI Presenting symptoms: congestion, cough and sore throat   Presenting symptoms: no fever   Associated symptoms: no wheezing    Joy Powers is a 25 y.o. female  with a PMH of asthma, seasonal allergies presents to the Emergency Department complaining of acute worsening of sinus pain, nasal congestion, and sore throat x 2 days. Patient states she was seen in ED on 12/15, noticed improvement over the last week/week-and-a-half, then acutely worsened. Denies fever at home, sob, difficulty swallowing, chest pain. Tried nebulizer albuterol tx with little relief.    Past Medical History  Diagnosis Date  . Asthma     controlled on symbicort, never hospitalized, never intubated  . Seasonal allergies     on allegra, nasonex  . Birth control     on tri-norinyl   History reviewed. No pertinent past surgical history. Family History  Problem Relation Age of Onset  . Hypertension Mother   . Asthma    . Allergies     Social History  Substance Use Topics  . Smoking status: Never Smoker   . Smokeless tobacco: Never Used  . Alcohol Use: No   OB History    No data available     Review of Systems  Constitutional: Negative for fever and chills.  HENT: Positive for congestion, sinus pressure and sore throat. Negative for facial swelling and trouble swallowing.   Eyes: Negative for discharge.  Respiratory: Positive for cough. Negative for choking, shortness of breath, wheezing and stridor.   Cardiovascular: Negative for chest pain and palpitations.  Gastrointestinal: Negative for  abdominal pain.  Skin: Negative for rash.      Allergies  Aleve and Ibuprofen  Home Medications   Prior to Admission medications   Medication Sig Start Date End Date Taking? Authorizing Provider  albuterol (PROVENTIL HFA;VENTOLIN HFA) 108 (90 BASE) MCG/ACT inhaler Inhale 2 puffs into the lungs every 6 (six) hours as needed. For shortness of breath 11/26/11   Myrlene BrokerElizabeth A Crawford, MD  albuterol (PROVENTIL) (5 MG/ML) 0.5% nebulizer solution Take 2.5 mg by nebulization every 6 (six) hours as needed. For shortness of breath    Historical Provider, MD  amoxicillin-clavulanate (AUGMENTIN) 875-125 MG tablet Take 1 tablet by mouth every 12 (twelve) hours. 01/17/15   Jud Fanguy Pilcher Kamon Fahr, PA-C  benzonatate (TESSALON) 100 MG capsule Take 1 capsule (100 mg total) by mouth every 8 (eight) hours. 12/30/14   Mady GemmaElizabeth C Westfall, PA-C  budesonide-formoterol [facility](SYMBICORT) 160-4.5 MCG/ACT inhaler Inhale 3 puffs into the lungs 3 (three) times daily.     Historical Provider, MD  cephALEXin (KEFLEX) 500 MG capsule Take 1 capsule (500 mg total) by mouth 4 (four) times daily. 08/19/14   Elpidio AnisShari Upstill, PA-C  fexofenadine-pseudoephedrine (ALLEGRA-D 24) 180-240 MG per 24 hr tablet Take 1 tablet by mouth daily.    Historical Provider, MD  fluticasone (FLONASE) 50 MCG/ACT nasal spray Place 2 sprays into the nose daily.    Historical Provider, MD  Norethindrone-Ethinyl Estradiol Triphasic (TRI-NORINYL, 28,) 0.5/1/0.5-35 MG-MCG tablet Take 1 tablet by mouth daily.      Historical Provider, MD  ondansetron (  ZOFRAN) 4 MG tablet Take 1 tablet (4 mg total) by mouth every 6 (six) hours. 08/19/14   Elpidio Anis, PA-C  oxyCODONE-acetaminophen (PERCOCET/ROXICET) 5-325 MG per tablet Take 2 tablets by mouth every 6 (six) hours as needed for pain. 08/06/12   Junious Silk, PA-C  promethazine (PHENERGAN) 25 MG tablet Take 1 tablet (25 mg total) by mouth every 6 (six) hours as needed for nausea. 08/06/12   Junious Silk, PA-C   BP 127/74  mmHg  Pulse 92  Temp(Src) 98.5 F (36.9 C) (Oral)  Resp 16  SpO2 99%  LMP 01/17/2015 Physical Exam  Constitutional: She is oriented to person, place, and time. She appears well-developed and well-nourished.  Alert and in no acute distress  HENT:  Head: Normocephalic and atraumatic.  Right Ear: Tympanic membrane, external ear and ear canal normal.  Left Ear: Tympanic membrane, external ear and ear canal normal.  Nose: Mucosal edema and rhinorrhea present. Right sinus exhibits no maxillary sinus tenderness and no frontal sinus tenderness. Left sinus exhibits frontal sinus tenderness. Left sinus exhibits no maxillary sinus tenderness.  Mouth/Throat: Uvula is midline, oropharynx is clear and moist and mucous membranes are normal. No oropharyngeal exudate, posterior oropharyngeal edema or posterior oropharyngeal erythema.  Cardiovascular: Normal rate, regular rhythm, normal heart sounds and intact distal pulses.  Exam reveals no gallop and no friction rub.   No murmur heard. Pulmonary/Chest: Effort normal and breath sounds normal. No respiratory distress. She has no wheezes. She has no rales.  Abdominal: She exhibits no mass. There is no rebound and no guarding.  Abdomen soft, non-tender, non-distended Bowel sounds positive in all four quadrants  Musculoskeletal: She exhibits no edema.  Neurological: She is alert and oriented to person, place, and time.  Skin: Skin is warm and dry. No rash noted.  Psychiatric: She has a normal mood and affect. Her behavior is normal. Judgment and thought content normal.  Nursing note and vitals reviewed.   ED Course  Procedures (including critical care time) Labs Review Labs Reviewed  RAPID STREP SCREEN (NOT AT [facility])  CULTURE, GROUP A STREP    Imaging Review Dg Chest 2 View  01/17/2015  CLINICAL DATA:  Cough EXAM: CHEST  2 VIEW COMPARISON:  12/30/2014 FINDINGS: Lungs are under aerated and clear. Upper normal heart size. No pneumothorax or pleural  effusion. IMPRESSION: No active cardiopulmonary disease. Electronically Signed   By: Jolaine Click M.D.   On: 01/17/2015 11:29   I have personally reviewed and evaluated these images and lab results as part of my medical decision-making.   EKG Interpretation None      MDM   Final diagnoses:  Cough  Acute frontal sinusitis, recurrence not specified  Asthma, unspecified asthma severity, uncomplicated   Joy Powers presents with worsening cough/congestion/sore throat/sinus pressure.   Labs: Rapid strep negative  Imaging: CXR with no acute cardiopulm dz.   A&P: Sinusitis / URI  - Augmentin x 5 days  - Flonase  - Home neb tx as needed  - Mucinex  - Increase hydration  - PCP follow up   Chase Picket Natalyn Szymanowski, PA-C 01/17/15 [address] Kadeisha Betsch, PA-C 01/17/15 1235  Alvira Monday, MD 01/17/15 2316

## 2015-01-17 NOTE — ED Notes (Signed)
Bed: WA01 Expected date:  Expected time:  Means of arrival:  Comments: 

## 2015-01-17 NOTE — Discharge Instructions (Signed)
1. Medications: flonase, mucinex as needed, Please take all of your antibiotics until finished!, usual home medications 2. Treatment: rest, drink plenty of fluids, take tylenol or ibuprofen for fever control 3. Follow Up: Please follow up with your primary doctor in 3 days for discussion of your diagnoses and further evaluation after today's visit; Return to the ER for high fevers, difficulty breathing or other concerning symptoms

## 2015-01-17 NOTE — ED Notes (Signed)
Per pt, states sore throat, congestion and body aches since Christmas Eve

## 2015-01-19 LAB — CULTURE, GROUP A STREP

## 2015-04-15 ENCOUNTER — Emergency Department (HOSPITAL_COMMUNITY)
Admission: EM | Admit: 2015-04-15 | Discharge: 2015-04-15 | Disposition: A | Discharge status: Home / Self Care | Payer: BLUE CROSS/BLUE SHIELD | Attending: Emergency Medicine | Admitting: Emergency Medicine

## 2015-04-15 ENCOUNTER — Emergency Department (HOSPITAL_COMMUNITY): Payer: BLUE CROSS/BLUE SHIELD

## 2015-04-15 ENCOUNTER — Encounter (HOSPITAL_COMMUNITY): Payer: Self-pay | Admitting: Emergency Medicine

## 2015-04-15 DIAGNOSIS — Z79899 Other long term (current) drug therapy: Secondary | ICD-10-CM | POA: Insufficient documentation

## 2015-04-15 DIAGNOSIS — R0602 Shortness of breath: Secondary | ICD-10-CM | POA: Diagnosis present

## 2015-04-15 DIAGNOSIS — J45901 Unspecified asthma with (acute) exacerbation: Secondary | ICD-10-CM

## 2015-04-15 DIAGNOSIS — Z7951 Long term (current) use of inhaled steroids: Secondary | ICD-10-CM | POA: Insufficient documentation

## 2015-04-15 MED ORDER — PREDNISONE 20 MG PO TABS
60.0000 mg | ORAL_TABLET | Freq: Once | ORAL | Status: AC
  Administered 2015-04-15: 60 mg via ORAL
  Filled 2015-04-15: qty 3

## 2015-04-15 MED ORDER — BENZONATATE 100 MG PO CAPS: 100.0000 mg | ORAL_CAPSULE | Freq: Three times a day (TID) | ORAL | Status: DC

## 2015-04-15 MED ORDER — ACETAMINOPHEN 325 MG PO TABS
650.0000 mg | ORAL_TABLET | Freq: Once | ORAL | Status: AC
  Administered 2015-04-15: 650 mg via ORAL
  Filled 2015-04-15: qty 2

## 2015-04-15 MED ORDER — ALBUTEROL SULFATE (2.5 MG/3ML) 0.083% IN NEBU: 5.0000 mg | INHALATION_SOLUTION | Freq: Once | RESPIRATORY_TRACT | Status: DC

## 2015-04-15 MED ORDER — ONDANSETRON HCL 4 MG PO TABS: 4.0000 mg | ORAL_TABLET | Freq: Four times a day (QID) | ORAL | Status: DC

## 2015-04-15 MED ORDER — AZITHROMYCIN 250 MG PO TABS: 250.0000 mg | ORAL_TABLET | Freq: Every day | ORAL | Status: DC

## 2015-04-15 MED ORDER — PREDNISONE 20 MG PO TABS: 60.0000 mg | ORAL_TABLET | Freq: Every day | ORAL | Status: DC

## 2015-04-15 NOTE — ED Provider Notes (Signed)
CSN: [id number]     Arrival date & time 04/15/15  0121 History   First MD Initiated Contact with Patient 04/15/15 [phone]     Chief Complaint  Patient presents with  . Shortness of Breath   HPI   Lyan D Nesheim is a 25 y.o. female PMH significant for asthma, seasonal allergies presenting with a 2 day history of shortness of breath. She states her shortness of breath similar to previous asthma exacerbations, has been intermittent. She states the pollen has been bothering her and endorses sick contacts (she is a school bus driver). She endorses a recent upper respiratory infection as well. She endorses coughing with posttussive emesis. She denies fevers, chills, chest pain when she is not coughing, abdominal pain, nausea, change in bowel or bladder habits.  Past Medical History  Diagnosis Date  . Asthma     controlled on symbicort, never hospitalized, never intubated  . Seasonal allergies     on allegra, nasonex  . Birth control     on tri-norinyl   History reviewed. No pertinent past surgical history. Family History  Problem Relation Age of Onset  . Hypertension Mother   . Asthma    . Allergies     Social History  Substance Use Topics  . Smoking status: Never Smoker   . Smokeless tobacco: Never Used  . Alcohol Use: No   OB History    No data available     Review of Systems  Ten systems are reviewed and are negative for acute change except as noted in the HPI  Allergies  Aleve  Home Medications   Prior to Admission medications   Medication Sig Start Date End Date Taking? Authorizing Provider  acetaminophen (TYLENOL) 500 MG tablet Take 1,000 mg by mouth every 6 (six) hours as needed for mild pain.   Yes Historical Provider, MD  albuterol (PROVENTIL HFA;VENTOLIN HFA) 108 (90 BASE) MCG/ACT inhaler Inhale 2 puffs into the lungs every 6 (six) hours as needed. For shortness of breath 11/26/11  Yes Myrlene Broker, MD  albuterol (PROVENTIL) (5 MG/ML) 0.5% nebulizer  solution Take 2.5 mg by nebulization every 6 (six) hours as needed. For shortness of breath   Yes Historical Provider, MD  budesonide-formoterol (SYMBICORT) 160-4.5 MCG/ACT inhaler Inhale 3 puffs into the lungs 3 (three) times daily.    Yes Historical Provider, MD  fexofenadine-pseudoephedrine (ALLEGRA-D 24) 180-240 MG per 24 hr tablet Take 1 tablet by mouth every other day.    Yes Historical Provider, MD  fluticasone (FLONASE) 50 MCG/ACT nasal spray Place 2 sprays into the nose daily.   Yes Historical Provider, MD  amoxicillin-clavulanate (AUGMENTIN) 875-125 MG tablet Take 1 tablet by mouth every 12 (twelve) hours. Patient not taking: Reported on 04/15/2015 01/17/15   [facility], PA-C  benzonatate (TESSALON) 100 MG capsule Take 1 capsule (100 mg total) by mouth every 8 (eight) hours. Patient not taking: Reported on 04/15/2015 12/30/14   Mady Gemma, PA-C  cephALEXin (KEFLEX) 500 MG capsule Take 1 capsule (500 mg total) by mouth 4 (four) times daily. Patient not taking: Reported on 04/15/2015 08/19/14   Elpidio Anis, PA-C  ondansetron (ZOFRAN) 4 MG tablet Take 1 tablet (4 mg total) by mouth every 6 (six) hours. Patient not taking: Reported on 04/15/2015 08/19/14   Elpidio Anis, PA-C  oxyCODONE-acetaminophen (PERCOCET/ROXICET) 5-325 MG per tablet Take 2 tablets by mouth every 6 (six) hours as needed for pain. Patient not taking: Reported on 04/15/2015 08/06/12   Junious Silk,  PA-C  promethazine (PHENERGAN) 25 MG tablet Take 1 tablet (25 mg total) by mouth every 6 (six) hours as needed for nausea. Patient not taking: Reported on 04/15/2015 08/06/12   Junious SilkHannah Merrell, PA-C   BP 99/70 mmHg  Pulse 61  Temp(Src) 98.1 F (36.7 C) (Oral)  Resp 18  SpO2 100%  LMP 03/11/2015 (Exact Date) Physical Exam  Constitutional: She appears well-developed and well-nourished. No distress.  HENT:  Head: Normocephalic and atraumatic.  Mouth/Throat: Oropharynx is clear and moist. No oropharyngeal exudate.   Eyes: Conjunctivae are normal. Pupils are equal, round, and reactive to light. Right eye exhibits no discharge. Left eye exhibits no discharge. No scleral icterus.  Neck: No tracheal deviation present.  Cardiovascular: Normal rate, regular rhythm, normal heart sounds and intact distal pulses.  Exam reveals no gallop and no friction rub.   No murmur heard. Pulmonary/Chest: Effort normal and breath sounds normal. No respiratory distress. She has no wheezes. She has no rales. She exhibits no tenderness.  Abdominal: Soft. Bowel sounds are normal. She exhibits no distension and no mass. There is no tenderness. There is no rebound and no guarding.  Musculoskeletal: She exhibits no edema.  Lymphadenopathy:    She has no cervical adenopathy.  Neurological: She is alert. Coordination normal.  Skin: Skin is warm and dry. No rash noted. She is not diaphoretic. No erythema.  Psychiatric: She has a normal mood and affect. Her behavior is normal.  Nursing note and vitals reviewed.   ED Course  Procedures   Imaging Review Dg Chest 2 View  04/15/2015  CLINICAL DATA:  25 year old female with shortness of breath EXAM: CHEST  2 VIEW COMPARISON:  Chest radiograph dated 01/17/2015 FINDINGS: The heart size and mediastinal contours are within normal limits. Both lungs are clear. The visualized skeletal structures are unremarkable. IMPRESSION: No active cardiopulmonary disease. Electronically Signed   By: Elgie CollardArash  Radparvar M.D.   On: 04/15/2015 02:44   I have personally reviewed and evaluated these images and lab results as part of my medical decision-making.  MDM   Final diagnoses:  Asthma, unspecified asthma severity, with acute exacerbation   Patient nontoxic-appearing, vital signs stable. Patient is satting at 100% on room air and is not tachycardic. No current signs of respiratory distress. Lung exam improved after nebulizer treatment. Chest x-ray ordered prior to my evaluation was unremarkable.  Prednisone given in the ED and pt will bd dc with 5 day burst. Pt states they are breathing at baseline. Pt has been instructed to continue using prescribed medications and to speak with PCP about today's exacerbation.    Melton KrebsSamantha Nicole Doll Frazee, PA-C 04/15/15 [phone]  Azalia BilisKevin Campos, MD 04/15/15 [phone]

## 2015-04-15 NOTE — ED Notes (Signed)
Pt states she has asthma and got short of breath earlier tonight around 11 pm  Pt states she has a nebulizer at home and took two treatments prior to coming  Pt states when the nebulizer was done the shortness of breath came back  Pt states she had a similar episode yesterday morning  Pt states tonight she was coughing so hard it made her vomit

## 2015-04-15 NOTE — Discharge Instructions (Signed)
[name],  Nice meeting you! Please follow-up with your primary care provider. Return to the emergency department if you develop increased shortness of breath, chest pain, nausea/vomiting, or are unable to keep foods down. Feel better soon!  S. Lane HackerNicole Beacher Every, PA-C Asthma, Adult Asthma is a recurring condition in which the airways tighten and narrow. Asthma can make it difficult to breathe. It can cause coughing, wheezing, and shortness of breath. Asthma episodes, also called asthma attacks, range from minor to life-threatening. Asthma cannot be cured, but medicines and lifestyle changes can help control it. CAUSES Asthma is believed to be caused by inherited (genetic) and environmental factors, but its exact cause is unknown. Asthma may be triggered by allergens, lung infections, or irritants in the air. Asthma triggers are different for each person. Common triggers include:   Animal dander.  Dust mites.  Cockroaches.  Pollen from trees or grass.  Mold.  Smoke.  Air pollutants such as dust, household cleaners, hair sprays, aerosol sprays, paint fumes, strong chemicals, or strong odors.  Cold air, weather changes, and winds (which increase molds and pollens in the air).  Strong emotional expressions such as crying or laughing hard.  Stress.  Certain medicines (such as aspirin) or types of drugs (such as beta-blockers).  Sulfites in foods and drinks. Foods and drinks that may contain sulfites include dried fruit, potato chips, and sparkling grape juice.  Infections or inflammatory conditions such as the flu, a cold, or an inflammation of the nasal membranes (rhinitis).  Gastroesophageal reflux disease (GERD).  Exercise or strenuous activity. SYMPTOMS Symptoms may occur immediately after asthma is triggered or many hours later. Symptoms include:  Wheezing.  Excessive nighttime or early morning coughing.  Frequent or severe coughing with a common cold.  Chest  tightness.  Shortness of breath. DIAGNOSIS  The diagnosis of asthma is made by a review of your medical history and a physical exam. Tests may also be performed. These may include:  Lung function studies. These tests show how much air you breathe in and out.  Allergy tests.  Imaging tests such as X-rays. TREATMENT  Asthma cannot be cured, but it can usually be controlled. Treatment involves identifying and avoiding your asthma triggers. It also involves medicines. There are 2 classes of medicine used for asthma treatment:   Controller medicines. These prevent asthma symptoms from occurring. They are usually taken every day.  Reliever or rescue medicines. These quickly relieve asthma symptoms. They are used as needed and provide short-term relief. Your health care provider will help you create an asthma action plan. An asthma action plan is a written plan for managing and treating your asthma attacks. It includes a list of your asthma triggers and how they may be avoided. It also includes information on when medicines should be taken and when their dosage should be changed. An action plan may also involve the use of a device called a peak flow meter. A peak flow meter measures how well the lungs are working. It helps you monitor your condition. HOME CARE INSTRUCTIONS   Take medicines only as directed by your health care provider. Speak with your health care provider if you have questions about how or when to take the medicines.  Use a peak flow meter as directed by your health care provider. Record and keep track of readings.  Understand and use the action plan to help minimize or stop an asthma attack without needing to seek medical care.  Control your home environment  in the following ways to help prevent asthma attacks:  Do not smoke. Avoid being exposed to secondhand smoke.  Change your heating and air conditioning filter regularly.  Limit your use of fireplaces and wood  stoves.  Get rid of pests (such as roaches and mice) and their droppings.  Throw away plants if you see mold on them.  Clean your floors and dust regularly. Use unscented cleaning products.  Try to have someone else vacuum for you regularly. Stay out of rooms while they are being vacuumed and for a short while afterward. If you vacuum, use a dust mask from a hardware store, a double-layered or microfilter vacuum cleaner bag, or a vacuum cleaner with a HEPA filter.  Replace carpet with wood, tile, or vinyl flooring. Carpet can trap dander and dust.  Use allergy-proof pillows, mattress covers, and box spring covers.  Wash bed sheets and blankets every week in hot water and dry them in a dryer.  Use blankets that are made of polyester or cotton.  Clean bathrooms and kitchens with bleach. If possible, have someone repaint the walls in these rooms with mold-resistant paint. Keep out of the rooms that are being cleaned and painted.  Wash hands frequently. SEEK MEDICAL CARE IF:   You have wheezing, shortness of breath, or a cough even if taking medicine to prevent attacks.  The colored mucus you cough up (sputum) is thicker than usual.  Your sputum changes from clear or white to yellow, green, gray, or bloody.  You have any problems that may be related to the medicines you are taking (such as a rash, itching, swelling, or trouble breathing).  You are using a reliever medicine more than 2-3 times per week.  Your peak flow is still at 50-79% of your personal best after following your action plan for 1 hour.  You have a fever. SEEK IMMEDIATE MEDICAL CARE IF:   You seem to be getting worse and are unresponsive to treatment during an asthma attack.  You are short of breath even at rest.  You get short of breath when doing very little physical activity.  You have difficulty eating, drinking, or talking due to asthma symptoms.  You develop chest pain.  You develop a fast  heartbeat.  You have a bluish color to your lips or fingernails.  You are light-headed, dizzy, or faint.  Your peak flow is less than 50% of your personal best.   This information is not intended to replace advice given to you by your health care provider. Make sure you discuss any questions you have with your health care provider.   Document Released: 01/01/2005 Document Revised: 09/22/2014 Document Reviewed: 07/31/2012 Elsevier Interactive Patient Education [employer]

## 2015-07-16 ENCOUNTER — Emergency Department (HOSPITAL_COMMUNITY)
Admission: EM | Admit: 2015-07-16 | Discharge: 2015-07-16 | Disposition: A | Discharge status: Home / Self Care | Payer: BLUE CROSS/BLUE SHIELD | Attending: Emergency Medicine | Admitting: Emergency Medicine

## 2015-07-16 ENCOUNTER — Encounter (HOSPITAL_COMMUNITY): Payer: Self-pay | Admitting: Emergency Medicine

## 2015-07-16 DIAGNOSIS — Y92410 Unspecified street and highway as the place of occurrence of the external cause: Secondary | ICD-10-CM | POA: Insufficient documentation

## 2015-07-16 DIAGNOSIS — M542 Cervicalgia: Secondary | ICD-10-CM | POA: Insufficient documentation

## 2015-07-16 DIAGNOSIS — J45909 Unspecified asthma, uncomplicated: Secondary | ICD-10-CM | POA: Insufficient documentation

## 2015-07-16 DIAGNOSIS — Y9389 Activity, other specified: Secondary | ICD-10-CM | POA: Insufficient documentation

## 2015-07-16 DIAGNOSIS — Y999 Unspecified external cause status: Secondary | ICD-10-CM | POA: Insufficient documentation

## 2015-07-16 DIAGNOSIS — Z79899 Other long term (current) drug therapy: Secondary | ICD-10-CM | POA: Diagnosis not present

## 2015-07-16 MED ORDER — IBUPROFEN 200 MG PO TABS
600.0000 mg | ORAL_TABLET | Freq: Once | ORAL | Status: AC
  Administered 2015-07-16: 600 mg via ORAL
  Filled 2015-07-16: qty 3

## 2015-07-16 MED ORDER — METHOCARBAMOL 500 MG PO TABS: 500.0000 mg | ORAL_TABLET | Freq: Two times a day (BID) | ORAL | Status: DC
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
## 2015-09-29 ENCOUNTER — Emergency Department (HOSPITAL_COMMUNITY)
Admission: EM | Admit: 2015-09-29 | Discharge: 2015-09-30 | Disposition: A | Discharge status: Home / Self Care | Payer: 59 | Attending: Emergency Medicine | Admitting: Emergency Medicine

## 2015-09-29 ENCOUNTER — Encounter (HOSPITAL_COMMUNITY): Payer: Self-pay | Admitting: *Deleted

## 2015-09-29 DIAGNOSIS — R1030 Lower abdominal pain, unspecified: Secondary | ICD-10-CM

## 2015-09-29 DIAGNOSIS — Z79899 Other long term (current) drug therapy: Secondary | ICD-10-CM | POA: Insufficient documentation

## 2015-09-29 DIAGNOSIS — N939 Abnormal uterine and vaginal bleeding, unspecified: Secondary | ICD-10-CM | POA: Insufficient documentation

## 2015-09-29 DIAGNOSIS — J45909 Unspecified asthma, uncomplicated: Secondary | ICD-10-CM | POA: Insufficient documentation

## 2015-09-29 DIAGNOSIS — Z7951 Long term (current) use of inhaled steroids: Secondary | ICD-10-CM | POA: Insufficient documentation

## 2015-09-29 LAB — COMPREHENSIVE METABOLIC PANEL
ALT: 14 U/L (ref 14–54)
AST: 19 U/L (ref 15–41)
Albumin: 3.8 g/dL (ref 3.5–5.0)
Alkaline Phosphatase: 69 U/L (ref 38–126)
Anion gap: 6 (ref 5–15)
BUN: 10 mg/dL (ref 6–20)
CO2: 26 mmol/L (ref 22–32)
Calcium: 9.1 mg/dL (ref 8.9–10.3)
Chloride: 106 mmol/L (ref 101–111)
Creatinine, Ser: 0.89 mg/dL (ref 0.44–1.00)
GFR calc Af Amer: >60 mL/min (ref >60)
GFR calc non Af Amer: >60 mL/min (ref >60)
Glucose, Bld: 99 mg/dL (ref 65–99)
Potassium: 3.9 mmol/L (ref 3.5–5.1)
Sodium: 138 mmol/L (ref 135–145)
Total Bilirubin: 0.5 mg/dL (ref 0.3–1.2)
Total Protein: 8.2 g/dL — ABNORMAL HIGH (ref 6.5–8.1)

## 2015-09-29 LAB — CBC
HCT: 36.4 % (ref 36.0–46.0)
Hemoglobin: 12.1 g/dL (ref 12.0–15.0)
MCH: 27.4 pg (ref 26.0–34.0)
MCHC: 33.2 g/dL (ref 30.0–36.0)
MCV: 82.5 fL (ref 78.0–100.0)
Platelets: 252 10*3/uL (ref 150–400)
RBC: 4.41 MIL/uL (ref 3.87–5.11)
RDW: 14.1 % (ref 11.5–15.5)
WBC: 8.2 10*3/uL (ref 4.0–10.5)

## 2015-09-29 LAB — I-STAT BETA HCG BLOOD, ED (MC, WL, AP ONLY): I-stat hCG, quantitative: <5 m[IU]/mL (ref <5)

## 2015-09-29 LAB — LIPASE, BLOOD: Lipase: 29 U/L (ref 11–51)

## 2015-09-29 MED ORDER — ONDANSETRON 4 MG PO TBDP
4.0000 mg | ORAL_TABLET | Freq: Once | ORAL | Status: AC
  Administered 2015-09-30: 4 mg via ORAL
  Filled 2015-09-29: qty 1

## 2015-09-29 NOTE — ED Triage Notes (Signed)
Pt states that she has unprotected sex approx 10 days ago and took the Plan B; pt states that yesterday she began to have abd pain and cramping and vaginal bleeding; pt also c/o nausea

## 2015-09-29 NOTE — ED Provider Notes (Signed)
[facility]-EMERGENCY DEPT Provider Note   CSN: [id number] Arrival date & time: 09/29/15  1950  By signing my name below, I, Alyssa Grove, attest that this documentation has been prepared under the direction and in the presence of [place], [place]. Electronically Signed: Alyssa Grove, ED Scribe. 09/29/15. 11:54 PM.  History   Chief Complaint Chief Complaint  Patient presents with  . Abdominal Pain   The history is provided by the patient. No language interpreter was used.    HPI Comments: Joy Powers is a 25 y.o. female who presents to the Emergency Department complaining of gradual onset, constant abdominal pain and cramping onset one day. Pt had unprotected sex 10 days ago and took Plan B on 09/20/2015. Pt reports vaginal bleeding comparable to her period since yesterday. She reports associated nausea and vomiting x1. Denies dizziness, vaginal discharge, or lightheadedness. Denies BRBPR or black tarry stools.   Past Medical History:  Diagnosis Date  . Asthma    controlled on symbicort, never hospitalized, never intubated  . Birth control    on tri-norinyl  . Seasonal allergies    on allegra, nasonex    Patient Active Problem List   Diagnosis Date Noted  . Asthma 09/05/2010  . Seasonal allergies 09/05/2010  . Birth control 09/05/2010    History reviewed. No pertinent surgical history.  OB History    No data available       Home Medications    Prior to Admission medications   Medication Sig Start Date End Date Taking? Authorizing Provider  albuterol (PROVENTIL HFA;VENTOLIN HFA) 108 (90 BASE) MCG/ACT inhaler Inhale 2 puffs into the lungs every 6 (six) hours as needed. For shortness of breath 11/26/11  Yes Myrlene Broker, MD  albuterol (PROVENTIL) (5 MG/ML) 0.5% nebulizer solution Take 2.5 mg by nebulization every 6 (six) hours as needed for wheezing or shortness of breath.    Yes Historical Provider, MD  budesonide-formoterol (SYMBICORT) 160-4.5 MCG/ACT inhaler Inhale  2 puffs into the lungs 2 (two) times daily as needed (SOB).    Yes Historical Provider, MD  Ibuprofen (MIDOL) 200 MG CAPS Take 400 mg by mouth every 6 (six) hours as needed (cramping).   Yes Historical Provider, MD  phentermine (ADIPEX-P) 37.5 MG tablet Take 37.5 mg by mouth daily before breakfast.   Yes Historical Provider, MD  azithromycin (ZITHROMAX) 250 MG tablet Take 1 tablet (250 mg total) by mouth daily. Take first 2 tablets together, then 1 every day until finished. Patient not taking: Reported on 09/29/2015 04/15/15   Melton Krebs, PA-C  benzonatate (TESSALON) 100 MG capsule Take 1 capsule (100 mg total) by mouth every 8 (eight) hours. Patient not taking: Reported on 09/29/2015 04/15/15   Melton Krebs, PA-C  methocarbamol (ROBAXIN) 500 MG tablet Take 1 tablet (500 mg total) by mouth 2 (two) times daily. Patient not taking: Reported on 09/29/2015 07/16/15   Waylan Boga Law, PA-C  ondansetron (ZOFRAN) 4 MG tablet Take 1 tablet (4 mg total) by mouth every 6 (six) hours. Patient not taking: Reported on 09/29/2015 04/15/15   Melton Krebs, PA-C  predniSONE (DELTASONE) 20 MG tablet Take 3 tablets (60 mg total) by mouth daily. Patient not taking: Reported on 09/29/2015 04/15/15   Melton Krebs, PA-C  promethazine (PHENERGAN) 25 MG tablet Take 1 tablet (25 mg total) by mouth every 6 (six) hours as needed for nausea. Patient not taking: Reported on 09/29/2015 08/06/12   Junious Silk, PA-C    Family History Family  History  Problem Relation Age of Onset  . Hypertension Mother   . Asthma    . Allergies      Social History Social History  Substance Use Topics  . Smoking status: Never Smoker  . Smokeless tobacco: Never Used  . Alcohol use No     Allergies   Aleve [naproxen sodium]   Review of Systems Review of Systems  Constitutional: Negative for fever.  Gastrointestinal: Positive for abdominal pain, nausea and vomiting.  Genitourinary: Positive for  vaginal bleeding. Negative for vaginal discharge.  Neurological: Negative for dizziness and light-headedness.  All other systems reviewed and are negative.   Physical Exam Updated Vital Signs BP 111/67 (BP Location: Left Arm)   Pulse 79   Temp 98.4 F (36.9 C) (Oral)   Resp 18   LMP 09/14/2015   SpO2 100%   Physical Exam  Constitutional: She is oriented to person, place, and time.  HENT:  Right Ear: External ear normal.  Left Ear: External ear normal.  Nose: Nose normal.  Mouth/Throat: Oropharynx is clear and moist. No oropharyngeal exudate.  Eyes: Conjunctivae are normal.  Neck: Neck supple.  Cardiovascular: Normal rate, regular rhythm, normal heart sounds and intact distal pulses.   Pulmonary/Chest: Effort normal and breath sounds normal. No respiratory distress. She has no wheezes.  Abdominal: Soft. Bowel sounds are normal. She exhibits no distension. There is no tenderness. There is no rebound and no guarding.  No CVA tenderness  Genitourinary:  Genitourinary Comments: refuses  Musculoskeletal: She exhibits no edema.  Lymphadenopathy:    She has no cervical adenopathy.  Neurological: She is alert and oriented to person, place, and time. No cranial nerve deficit.  Skin: Skin is warm and dry.  Psychiatric: She has a normal mood and affect.  Nursing note and vitals reviewed.    ED Treatments / Results  DIAGNOSTIC STUDIES: Oxygen Saturation is 100% on RA, normal by my interpretation.    COORDINATION OF CARE: 11:50 PM Discussed treatment plan with pt at bedside which includes Zofran and pt agreed to plan.  Labs (all labs ordered are listed, but only abnormal results are displayed) Labs Reviewed  COMPREHENSIVE METABOLIC PANEL - Abnormal; Notable for the following:       Result Value   Total Protein 8.2 (*)    All other components within normal limits  LIPASE, BLOOD  CBC  I-STAT BETA HCG BLOOD, ED ([facility], [facility], [facility] ONLY)  GC/CHLAMYDIA PROBE AMP ([place]) NOT AT  [facility]    EKG  EKG Interpretation None       Radiology No results found.  Procedures Procedures (including critical care time)  Medications Ordered in ED Medications  ondansetron (ZOFRAN-ODT) disintegrating tablet 4 mg (not administered)     Initial Impression / Assessment and Plan / ED Course  I have reviewed the triage vital signs and the nursing notes.  Pertinent labs & imaging results that were available during my care of the patient were reviewed by me and considered in my medical decision making (see chart for details).  Clinical Course    Pt hemodynamically stable. Labs unrevealing. Abdominal exam benign. Pt refuses pelvic exam. She likely has bleeding/cramping from Plan B pill. Her nausea is improved with zofran PO. Tolerating PO now. No indication for further emergent imaging or workup. Encouraged close f/u with OB/GYN and PCP. Strict ER return precautions given.  Final Clinical Impressions(s) / ED Diagnoses   Final diagnoses:  Lower abdominal pain  Vaginal bleeding    New  Prescriptions Discharge Medication List as of 09/30/2015  1:15 AM     I personally performed the services described in this documentation, which was scribed in my presence. The recorded information has been reviewed and is accurate.    Carlene Coria, PA-C 09/30/15 Keturah Barre, MD 09/30/15 2251

## 2015-09-30 NOTE — Discharge Instructions (Signed)
Your labs today were normal. You declined pelvic exam. Your symptoms are likely due to the Plan B pill. Please follow up with your primary care provider or the women's outpatient clinic as soon as possible.

## 2015-12-15 ENCOUNTER — Other Ambulatory Visit: Payer: Self-pay | Admitting: Physician Assistant

## 2015-12-15 DIAGNOSIS — N941 Unspecified dyspareunia: Secondary | ICD-10-CM

## 2015-12-22 ENCOUNTER — Other Ambulatory Visit: Payer: 59

## 2016-01-02 ENCOUNTER — Ambulatory Visit
Admission: RE | Admit: 2016-01-02 | Discharge: 2016-01-02 | Disposition: A | Discharge status: Home / Self Care | Payer: BC Managed Care – PPO | Source: Ambulatory Visit | Attending: Physician Assistant | Admitting: Physician Assistant

## 2016-01-02 DIAGNOSIS — N941 Unspecified dyspareunia: Secondary | ICD-10-CM

## 2016-08-31 ENCOUNTER — Encounter (HOSPITAL_COMMUNITY): Payer: Self-pay | Admitting: Emergency Medicine

## 2016-08-31 DIAGNOSIS — B349 Viral infection, unspecified: Secondary | ICD-10-CM | POA: Insufficient documentation

## 2016-08-31 DIAGNOSIS — J029 Acute pharyngitis, unspecified: Secondary | ICD-10-CM | POA: Insufficient documentation

## 2016-08-31 DIAGNOSIS — J45909 Unspecified asthma, uncomplicated: Secondary | ICD-10-CM | POA: Diagnosis not present

## 2016-08-31 DIAGNOSIS — Z79899 Other long term (current) drug therapy: Secondary | ICD-10-CM | POA: Insufficient documentation

## 2016-08-31 MED ORDER — ACETAMINOPHEN 500 MG PO TABS
ORAL_TABLET | ORAL | Status: DC
Start: 2016-08-31 — End: 2016-09-01
  Filled 2016-08-31: qty 2

## 2016-08-31 MED ORDER — ACETAMINOPHEN 325 MG PO TABS
650.0000 mg | ORAL_TABLET | Freq: Once | ORAL | Status: AC | PRN
  Administered 2016-08-31: 650 mg via ORAL

## 2016-08-31 NOTE — ED Triage Notes (Signed)
Pt presents to ED for assessment of sore throat, blood shot eyes, cough with green sputum, nasal congestion, body aches and fever.

## 2016-09-01 ENCOUNTER — Emergency Department (HOSPITAL_COMMUNITY): Payer: BC Managed Care – PPO

## 2016-09-01 ENCOUNTER — Emergency Department (HOSPITAL_COMMUNITY)
Admission: EM | Admit: 2016-09-01 | Discharge: 2016-09-01 | Disposition: A | Discharge status: Home / Self Care | Payer: BC Managed Care – PPO | Attending: Emergency Medicine | Admitting: Emergency Medicine

## 2016-09-01 DIAGNOSIS — J029 Acute pharyngitis, unspecified: Secondary | ICD-10-CM

## 2016-09-01 LAB — CBC WITH DIFFERENTIAL/PLATELET
Basophils Absolute: 0 10*3/uL (ref 0.0–0.1)
Basophils Relative: 0 %
Eosinophils Absolute: 0.1 10*3/uL (ref 0.0–0.7)
Eosinophils Relative: 1 %
HCT: 36.3 % (ref 36.0–46.0)
Hemoglobin: 12.1 g/dL (ref 12.0–15.0)
Lymphocytes Relative: 18 %
Lymphs Abs: 1.4 10*3/uL (ref 0.7–4.0)
MCH: 27.5 pg (ref 26.0–34.0)
MCHC: 33.3 g/dL (ref 30.0–36.0)
MCV: 82.5 fL (ref 78.0–100.0)
Monocytes Absolute: 0.8 10*3/uL (ref 0.1–1.0)
Monocytes Relative: 10 %
Neutro Abs: 5.7 10*3/uL (ref 1.7–7.7)
Neutrophils Relative %: 71 %
Platelets: 220 10*3/uL (ref 150–400)
RBC: 4.4 MIL/uL (ref 3.87–5.11)
RDW: 14.7 % (ref 11.5–15.5)
WBC: 8 10*3/uL (ref 4.0–10.5)

## 2016-09-01 LAB — I-STAT CHEM 8, ED
BUN: 10 mg/dL (ref 6–20)
Calcium, Ion: 1.04 mmol/L — ABNORMAL LOW (ref 1.15–1.40)
Chloride: 108 mmol/L (ref 101–111)
Creatinine, Ser: 0.8 mg/dL (ref 0.44–1.00)
Glucose, Bld: 100 mg/dL — ABNORMAL HIGH (ref 65–99)
HCT: 38 % (ref 36.0–46.0)
Hemoglobin: 12.9 g/dL (ref 12.0–15.0)
Potassium: 3.7 mmol/L (ref 3.5–5.1)
Sodium: 138 mmol/L (ref 135–145)
TCO2: 20 mmol/L (ref 0–100)

## 2016-09-01 LAB — RAPID STREP SCREEN (MED CTR MEBANE ONLY): Streptococcus, Group A Screen (Direct): NEGATIVE

## 2016-09-01 MED ORDER — ACETAMINOPHEN 500 MG PO TABS: 1000.0000 mg | ORAL_TABLET | Freq: Once | ORAL | Status: DC

## 2016-09-01 MED ORDER — SODIUM CHLORIDE 0.9 % IV BOLUS (SEPSIS)
1000.0000 mL | Freq: Once | INTRAVENOUS | Status: AC
  Administered 2016-09-01: 1000 mL via INTRAVENOUS

## 2016-09-01 MED ORDER — DEXAMETHASONE SODIUM PHOSPHATE 10 MG/ML IJ SOLN
10.0000 mg | Freq: Once | INTRAMUSCULAR | Status: AC
  Administered 2016-09-01: 10 mg via INTRAVENOUS
  Filled 2016-09-01: qty 1

## 2016-09-01 NOTE — Discharge Instructions (Signed)
Your strep test is negative. White count is sign of infection is normal without any elevation he was given IV fluids and Tylenol for your temperature and a medication called Decadron should decrease the swelling in his throat. Is no need for antibiotics as this is a virus that will not respond to antibiotics
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
## 2016-09-03 LAB — CULTURE, GROUP A STREP (THRC)

## 2016-09-06 IMAGING — CR DG CHEST 2V
2 series · 2 of 2 positions shown · non-contrast
Comparison: 12/30/2014

CLINICAL DATA: Cough

EXAM:
CHEST  2 VIEW

[w chest pa]
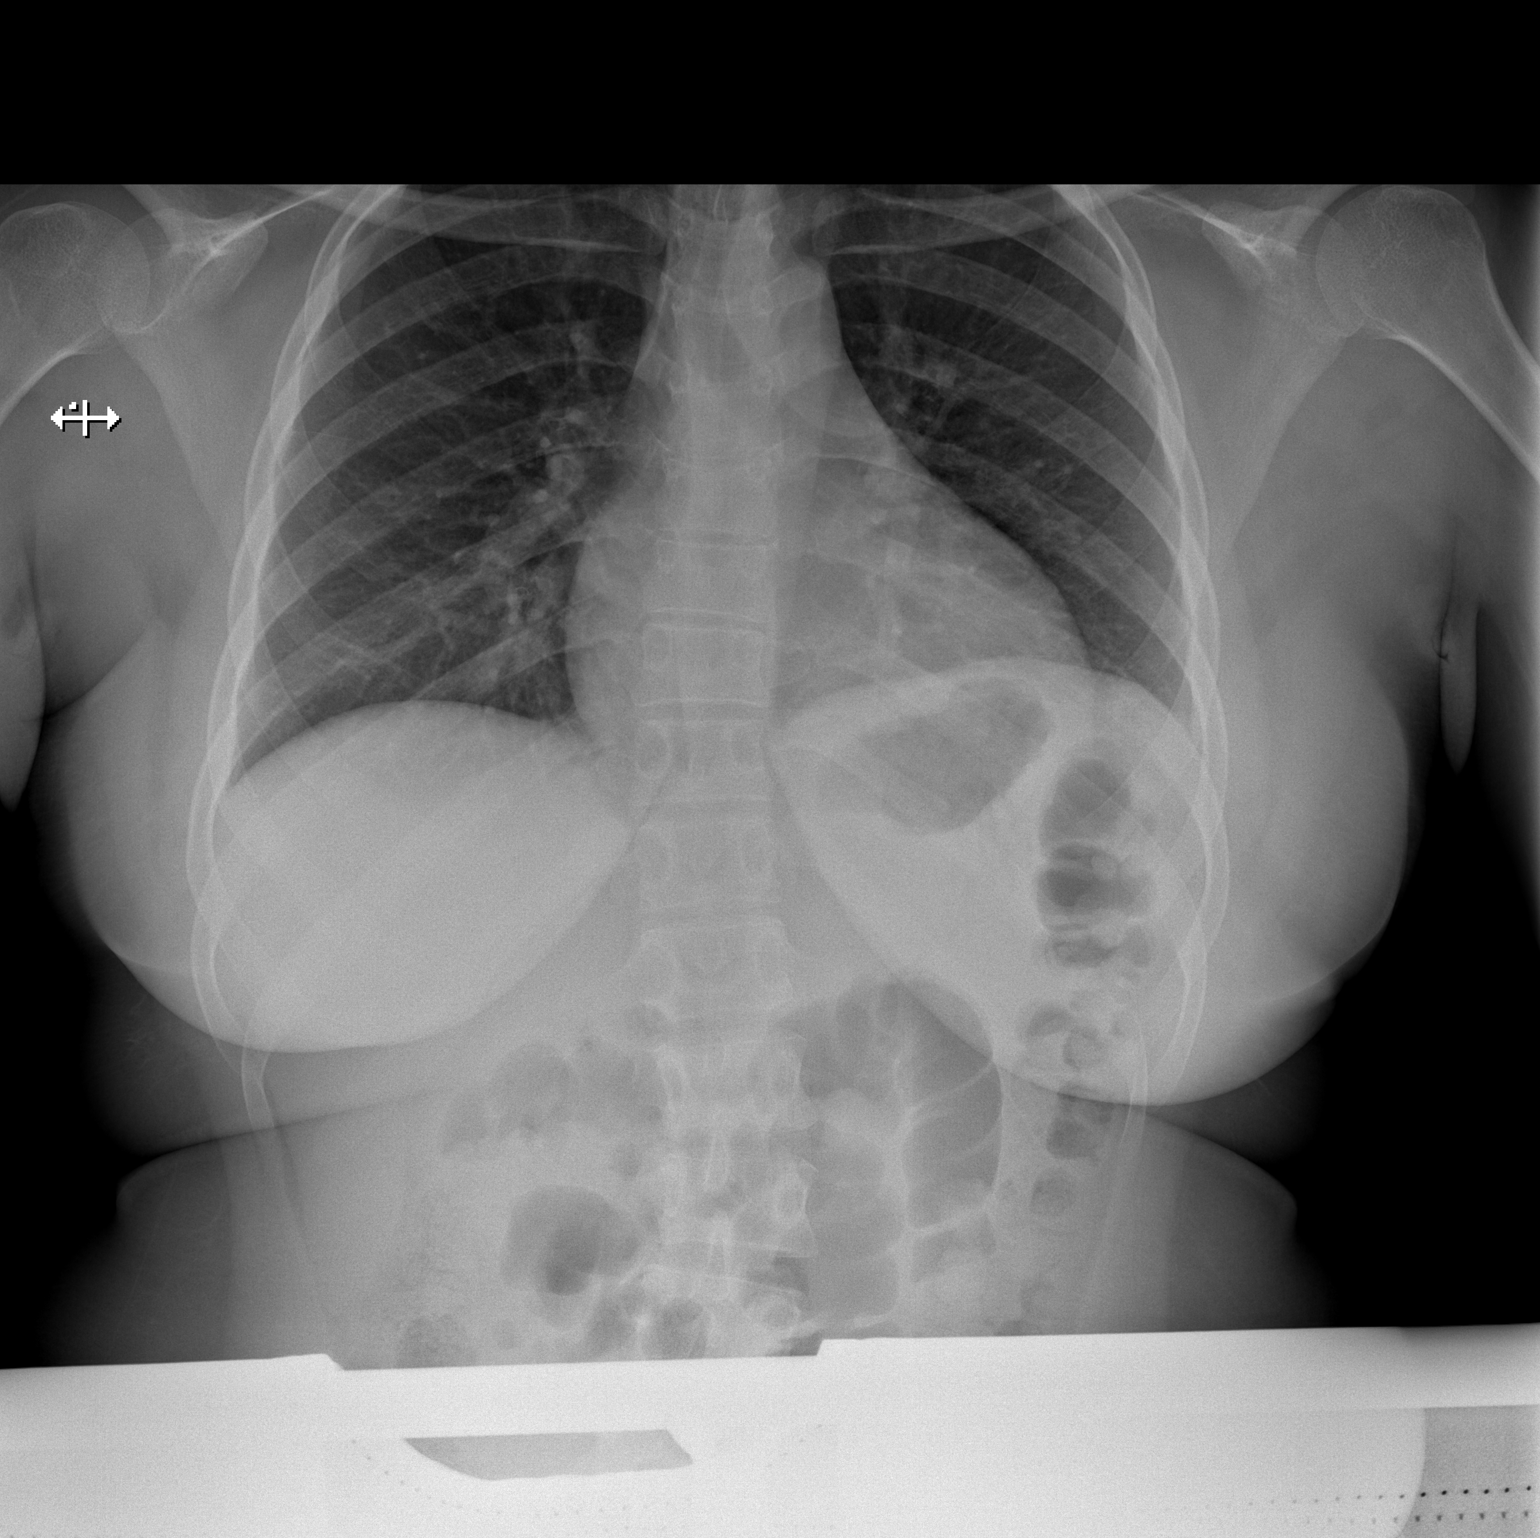

[w chest lat]
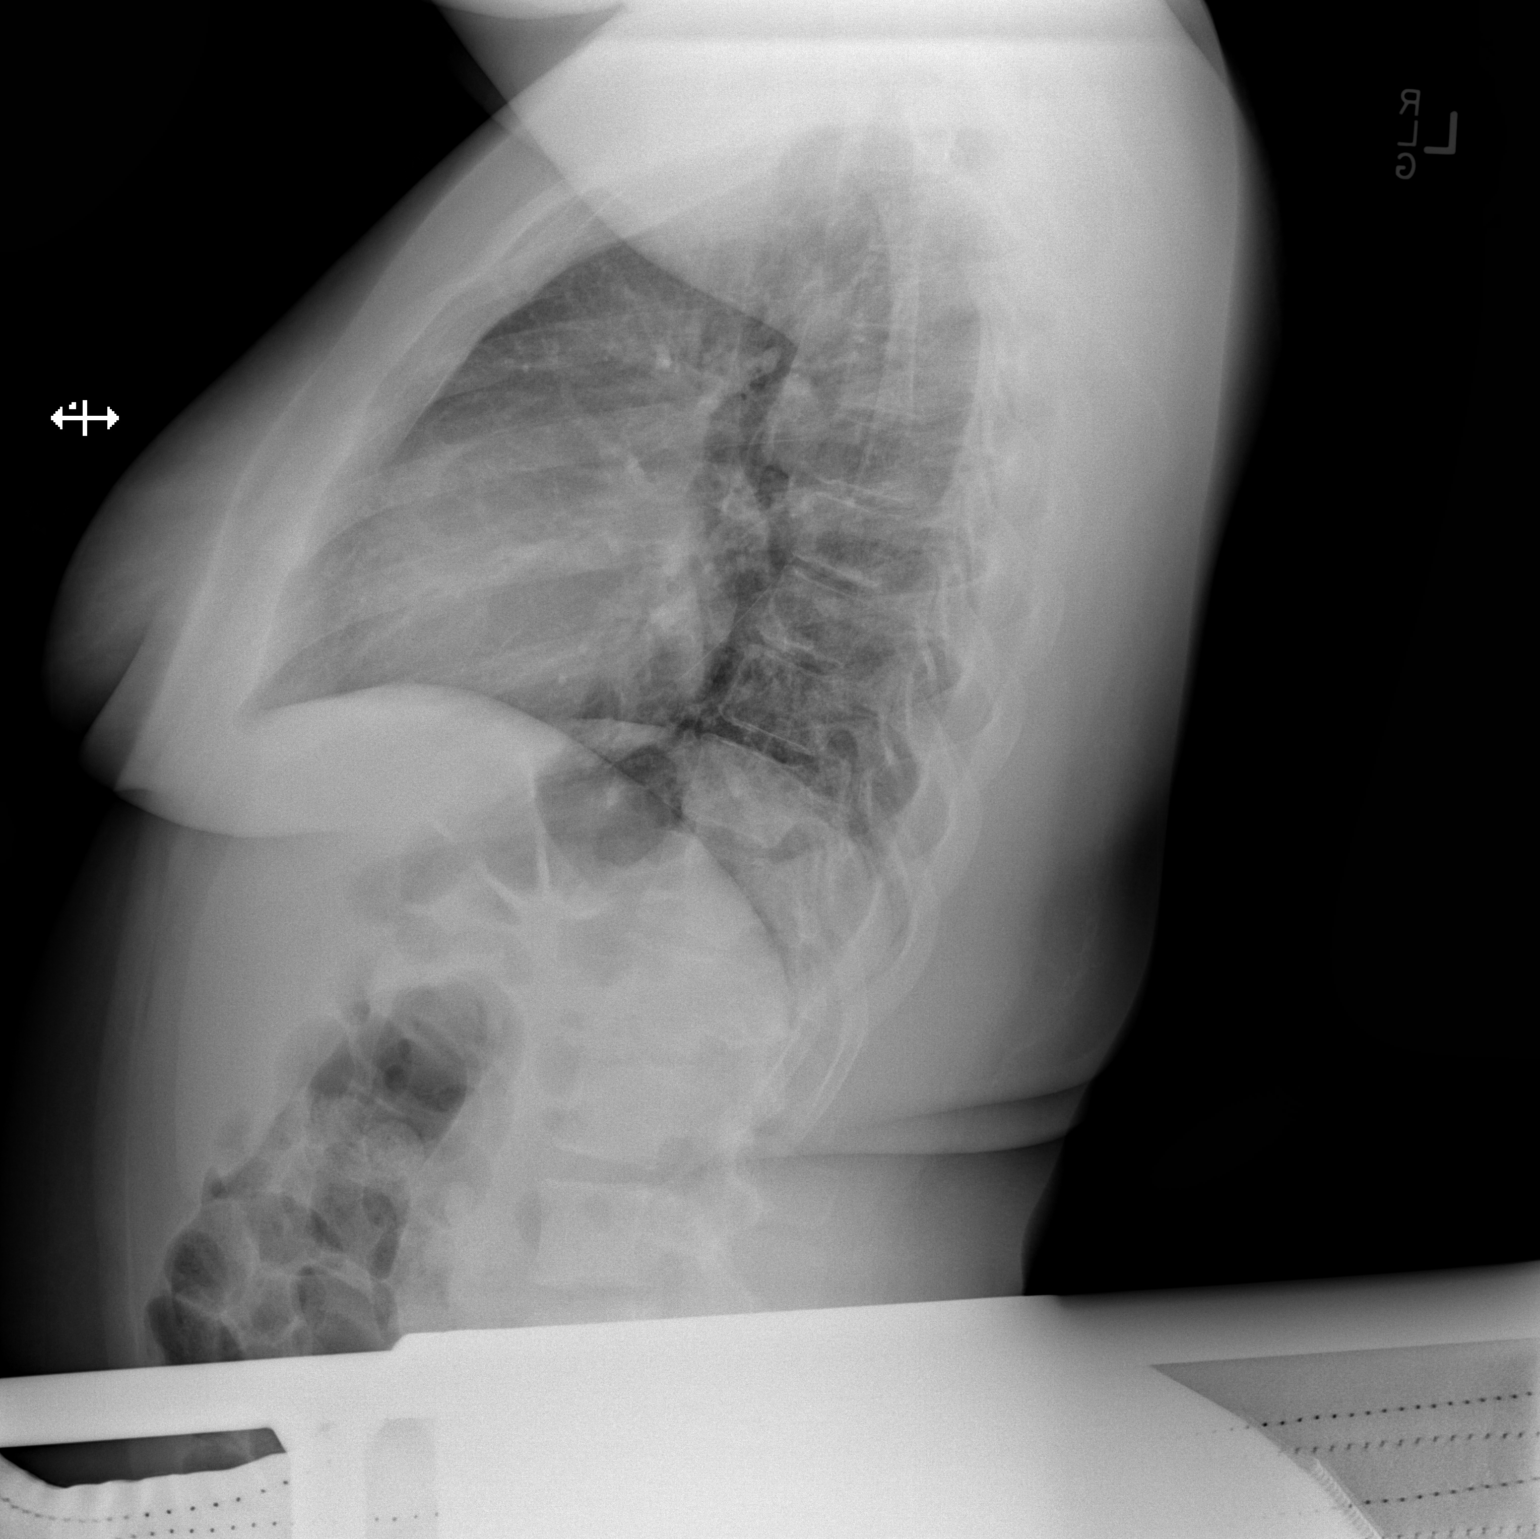

[2 of 2 positions shown; findings below may reference images not displayed]

FINDINGS: Lungs are under aerated and clear. Upper normal heart size. No
pneumothorax or pleural effusion.
IMPRESSION: No active cardiopulmonary disease.

## 2017-04-19 ENCOUNTER — Other Ambulatory Visit (HOSPITAL_BASED_OUTPATIENT_CLINIC_OR_DEPARTMENT_OTHER): Payer: Self-pay

## 2017-04-19 DIAGNOSIS — R0683 Snoring: Secondary | ICD-10-CM

## 2017-04-19 DIAGNOSIS — R5383 Other fatigue: Secondary | ICD-10-CM

## 2017-05-17 ENCOUNTER — Ambulatory Visit (HOSPITAL_BASED_OUTPATIENT_CLINIC_OR_DEPARTMENT_OTHER): Payer: BC Managed Care – PPO | Attending: Physician Assistant | Admitting: Internal Medicine

## 2017-05-17 VITALS — Ht 63.0 in | Wt 235.0 lb

## 2017-05-17 DIAGNOSIS — R0683 Snoring: Secondary | ICD-10-CM | POA: Diagnosis not present

## 2017-05-17 DIAGNOSIS — R5383 Other fatigue: Secondary | ICD-10-CM

## 2017-06-03 DIAGNOSIS — R0683 Snoring: Secondary | ICD-10-CM

## 2017-06-03 NOTE — Procedures (Signed)
   Patient Name: Joy Powers, Joy Powers Study Date: 05/17/2017 Gender: Female D.O.B: 1990-02-13 Age (years): 27 Referring Provider: Norva Riffle Height (inches): 63 Interpreting Physician: Jetty Duhamel MD, ABSM Weight (lbs): 235 RPSGT: Rolene Arbour BMI: 42 MRN: [id number] Neck Size: 18.00  CLINICAL INFORMATION Sleep Study Type: NPSG Indication for sleep study: Fatigue, Snoring  Epworth Sleepiness Score:  16  SLEEP STUDY TECHNIQUE As per the AASM Manual for the Scoring of Sleep and Associated Events v2.3 (April 2016) with a hypopnea requiring 4% desaturations.  The channels recorded and monitored were frontal, central and occipital EEG, electrooculogram (EOG), submentalis EMG (chin), nasal and oral airflow, thoracic and abdominal wall motion, anterior tibialis EMG, snore microphone, electrocardiogram, and pulse oximetry.  MEDICATIONS Medications self-administered by patient taken the night of the study : none reported  SLEEP ARCHITECTURE The study was initiated at 9:34:41 PM and ended at 4:29:15 AM.  Sleep onset time was 1.3 minutes and the sleep efficiency was 98.2%%. The total sleep time was 407.3 minutes.  Stage REM latency was 50.0 minutes.  The patient spent 0.8%% of the night in stage N1 sleep, 81.0%% in stage N2 sleep, 0.7%% in stage N3 and 17.43% in REM.  Alpha intrusion was absent.  Supine sleep was 76.39%.  RESPIRATORY PARAMETERS The overall apnea/hypopnea index (AHI) was 4.7 per hour. There were 2 total apneas, including 2 obstructive, 0 central and 0 mixed apneas. There were 30 hypopneas and 15 RERAs.  The AHI during Stage REM sleep was 16.1 per hour.  AHI while supine was 4.2 per hour.  The mean oxygen saturation was 95.5%. The minimum SpO2 during sleep was 85.0%.  moderate snoring was noted during this study.  CARDIAC DATA The 2 lead EKG demonstrated sinus rhythm. The mean heart rate was 76.2 beats per minute. Other EKG findings include: PACs, short  interval of PAT.  LEG MOVEMENT DATA The total PLMS were 0 with a resulting PLMS index of 0.0. Associated arousal with leg movement index was 0.0 .  IMPRESSIONS - Occasional obstructive sleep apneas occurred during this study, within normal limits, (AHI = 4.7/h). Events mostly confined to REM. - No significant central sleep apnea occurred during this study (CAI = 0.0/h). - Mild oxygen desaturation was noted during this study (Min O2 = 85.0%). Mean 95.5%. - The patient snored with moderate snoring volume. - Cardiac rhythm showed short interval of PAT, occasional PACs.. - Clinically significant periodic limb movements did not occur during sleep. No significant associated arousals.  DIAGNOSIS - Primary snoring  RECOMMENDATIONS - Be careful with alcohol, sedatives and other CNS depressants that may worsen sleep apnea and disrupt normal sleep architecture. - Sleep hygiene should be reviewed to assess factors that may improve sleep quality. Discuss sleep habits and environment. - Weight management and regular exercise should be initiated or continued if appropriate.  [Electronically signed] 06/03/2017 08:26 AM  Jetty Duhamel MD, ABSM Diplomate, American Board of Sleep Medicine   NPI: [id number]                          Jetty Duhamel Diplomate, American Board of Sleep Medicine  ELECTRONICALLY SIGNED ON:  06/03/2017, 8:21 AM [facility] PH: [phone]   FX: [phone] ACCREDITED BY THE AMERICAN ACADEMY OF SLEEP MEDICINE

## 2019-07-01 ENCOUNTER — Other Ambulatory Visit: Payer: Self-pay | Admitting: Obstetrics and Gynecology

## 2019-07-01 DIAGNOSIS — N939 Abnormal uterine and vaginal bleeding, unspecified: Secondary | ICD-10-CM

## 2019-07-13 ENCOUNTER — Other Ambulatory Visit: Payer: BC Managed Care – PPO

## 2020-04-19 ENCOUNTER — Encounter: Payer: Self-pay | Admitting: Allergy

## 2020-04-19 ENCOUNTER — Other Ambulatory Visit: Payer: Self-pay

## 2020-04-19 ENCOUNTER — Ambulatory Visit: Payer: BC Managed Care – PPO | Admitting: Allergy

## 2020-04-19 VITALS — BP 118/86 | HR 80 | Temp 97.4°F | Resp 18 | Ht 64.0 in | Wt 247.0 lb

## 2020-04-19 DIAGNOSIS — J3089 Other allergic rhinitis: Secondary | ICD-10-CM | POA: Diagnosis not present

## 2020-04-19 DIAGNOSIS — J45909 Unspecified asthma, uncomplicated: Secondary | ICD-10-CM

## 2020-04-19 DIAGNOSIS — H1013 Acute atopic conjunctivitis, bilateral: Secondary | ICD-10-CM | POA: Insufficient documentation

## 2020-04-19 MED ORDER — LEVALBUTEROL TARTRATE 45 MCG/ACT IN AERO: 2.0000 | INHALATION_SPRAY | RESPIRATORY_TRACT | 2 refills | Status: DC | PRN

## 2020-04-19 MED ORDER — MONTELUKAST SODIUM 10 MG PO TABS
10.0000 mg | ORAL_TABLET | Freq: Every day | ORAL | 5 refills | Status: DC
Start: 2020-04-19 — End: 2020-06-23

## 2020-04-19 MED ORDER — LEVALBUTEROL HCL 1.25 MG/3ML IN NEBU: 1.2500 mg | INHALATION_SOLUTION | RESPIRATORY_TRACT | 2 refills | Status: DC | PRN

## 2020-04-19 NOTE — Assessment & Plan Note (Signed)
Diagnosed with asthma over 20+ years ago and noticing worsening symptoms the past 6 months especially when exposed to strong scents/perfumes. Currently on Symbicort 2 puffs once a day and using albuterol neb daily. Albuterol causes some shakiness.   Today's spirometry showed restrictive disease with 11% improvement in FEV1 post bronchodilator treatment.   Asthma not well controlled with current regimen. Hopefull with new regimen and better control her asthma triggers won't be as bothersome. Discussed with patient that is no skin prick testing available for perfumes/scents.  . Daily controller medication(s): INCREASE Symbicort 2 puffs to TWICE per day with spacer and rinse mouth afterwards. Start Singulair (montelukast) 10mg  daily at night. Cautioned that in some children/adults can experience behavioral changes including hyperactivity, agitation, depression, sleep disturbances and suicidal ideations. These side effects are rare, but if you notice them you should notify me and discontinue Singulair (montelukast). . May use LEVOalbuterol rescue inhaler 2 puffs or nebulizer every 4 to 6 hours as needed for shortness of breath, chest tightness, coughing, and wheezing. May use LEVOalbuterol rescue inhaler 2 puffs 5 to 15 minutes prior to strenuous physical activities. Monitor frequency of use.   Rx sent in for Levoalbuterol due to side effects from albuterol.  Start antibiotics and prednisone as per your PCP.   Repeat spirometry at next visit.

## 2020-04-19 NOTE — Assessment & Plan Note (Signed)
Mild rhino conjunctivitis symptoms. Used to take Claritin and Singulair but now on allegra and Flonase. AIT as a child but not sure for how long. 2021 skin testing at outside facility was positive to grass, trees, cats per patent report - in process of obtaining records.  Continue environmental control measures.  Start Singulair (montelukast) 10mg  daily at night.  May use over the counter antihistamines such as Allegra (fexofenadine) daily.   May use Flonase (fluticasone) nasal spray 1 spray per nostril twice a day as needed for nasal congestion.   Nasal saline spray (i.e., Simply Saline) or nasal saline lavage (i.e., NeilMed) is recommended as needed and prior to medicated nasal sprays.

## 2020-04-19 NOTE — Patient Instructions (Addendum)
I will review records from you previous allergist.   Asthma: . Avoid your known triggers to the best of your ability but also your asthma is not controlled based on the clinical history you provided. o Hopefully once your asthma is better controlled - your asthma triggers won't bother you as much.  o No test skin prick testing available for perfumes/scents.   . Daily controller medication(s): INCREASE Symbicort 2 puffs to TWICE per day with spacer and rinse mouth afterwards. Start Singulair (montelukast) 10mg  daily at night. Cautioned that in some children/adults can experience behavioral changes including hyperactivity, agitation, depression, sleep disturbances and suicidal ideations. These side effects are rare, but if you notice them you should notify me and discontinue Singulair (montelukast).  . May use LEVOalbuterol rescue inhaler 2 puffs or nebulizer every 4 to 6 hours as needed for shortness of breath, chest tightness, coughing, and wheezing. May use LEVOalbuterol rescue inhaler 2 puffs 5 to 15 minutes prior to strenuous physical activities. Monitor frequency of use.  . Levoalbuterol usually has less side effects.  . Asthma control goals:  o Full participation in all desired activities (may need albuterol before activity) o Albuterol use two times or less a week on average (not counting use with activity) o Cough interfering with sleep two times or less a month o Oral steroids no more than once a year o No hospitalizations  Allergic rhinitis:  Continue environmental control measures.  Start Singulair (montelukast) 10mg  daily at night.  May use over the counter antihistamines such as Allegra (fexofenadine) daily.   May use Flonase (fluticasone) nasal spray 1 spray per nostril twice a day as needed for nasal congestion.   Nasal saline spray (i.e., Simply Saline) or nasal saline lavage (i.e., NeilMed) is recommended as needed and prior to medicated nasal sprays.  Follow up  in 1 months or sooner if needed.  Start antibiotics and prednisone as per your PCP.   Reducing Pollen Exposure . Pollen seasons: trees (spring), grass (summer) and ragweed/weeds (fall). 01-[birth year]-1982 Keep windows closed in your home and car to lower pollen exposure.  05-24-1980 air conditioning in the bedroom and throughout the house if possible.  . Avoid going out in dry windy days - especially early morning. . Pollen counts are highest between 5 - 10 AM and on dry, hot and windy days.  . Save outside activities for late afternoon or after a heavy rain, when pollen levels are lower.  . Avoid mowing of grass if you have grass pollen allergy. Marland Kitchen Be aware that pollen can also be transported indoors on people and pets.  . Dry your clothes in an automatic dryer rather than hanging them outside where they might collect pollen.  . Rinse hair and eyes before bedtime. Pet Allergen Avoidance: . Contrary to popular opinion, there are no "hypoallergenic" breeds of dogs or cats. That is because people are not allergic to an animal's hair, but to an allergen found in the animal's saliva, dander (dead skin flakes) or urine. Pet allergy symptoms typically occur within minutes. For some people, symptoms can build up and become most severe 8 to 12 hours after contact with the animal. People with severe allergies can experience reactions in public places if dander has been transported on the pet owners' clothing. Lilian Kapur Keeping an animal outdoors is only a partial solution, since homes with pets in the yard still have higher concentrations of animal allergens. . Before getting a pet, ask your allergist to determine if you  are allergic to animals. If your pet is already considered part of your family, try to minimize contact and keep the pet out of the bedroom and other rooms where you spend a great deal of time. . As with dust mites, vacuum carpets often or replace carpet with a hardwood floor, tile or linoleum. . High-efficiency  particulate air (HEPA) cleaners can reduce allergen levels over time. . While dander and saliva are the source of cat and dog allergens, urine is the source of allergens from rabbits, hamsters, mice and [place] pigs; so ask a non-allergic family member to clean the animal's cage. . If you have a pet allergy, talk to your allergist about the potential for allergy immunotherapy (allergy shots). This strategy can often provide long-term relief.

## 2020-04-19 NOTE — Progress Notes (Addendum)
New Patient Note  RE: Joy Powers MRN: [id number] DOB: [birth year] Date of Office Visit: 04/19/2020  Consult requested by: No ref. provider found Primary care provider: Caffie Damme, MD  Chief Complaint: Asthma (With perfumes, candles and soaps and flowers she gets ligh headed, dizzy, and vomits. She had a asthma attack 1 week ago after getting the breeze in thru the bus window  and does have chest pain. She was allergy tested at [facility] this year Dr Rolly Salter.)  History of Present Illness: I had the pleasure of seeing Karl Ito for initial evaluation at the [facility] on 04/19/2020. She is a 30 y.o. female, who is self-referred here for the evaluation of asthma.  She reports symptoms of chest tightness, dizziness, lightheaded, shortness of breath, coughing with mucous at times, wheezing, nocturnal awakenings for 20+ years but worse the past 6 months. Current medications include Symbicort 2 puffs once a day in the AM x many years and albuterol nebulizer daily which help. She reports not using aerochamber with inhalers. She tried the following inhalers: Advair.   Main triggers are strong perfumes and cologne. 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 In the last month, frequency of symptoms: daily. Frequency of nocturnal symptoms: 2x/week. Frequency of SABA use: daily for the past 2 years.  Albuterol causes shakiness  Interference with physical activity: no. Sleep is disturbed. In the last 12 months, emergency room visits/urgent care visits/doctor office visits or hospitalizations due to respiratory issues: 2. In the last 12 months, oral steroids courses: currently prescribed zpak and prednisone for ? Pneumonia on recent imaging. No fevers/chills.  Lifetime history of hospitalization for respiratory issues: no. Prior intubations: no. History of pneumonia: yes. She was evaluated by allergist in the past. Smoking exposure: no. Up to date  with flu vaccine: no. Up to date with COVID-19 vaccine: yes. No prior COVID-19 diagnosis.   History of reflux: no.  Patient saw an allergist at [facility] and had skin testing done in 2021 and was positive to grass, trees, cats per patient report - records requested.   Assessment and Plan: Miraya is a 31 y.o. female with: Asthma, not well controlled, unspecified asthma severity, uncomplicated Diagnosed with asthma over 20+ years ago and noticing worsening symptoms the past 6 months especially when exposed to strong scents/perfumes. Currently on Symbicort 2 puffs once a day and using albuterol neb daily. Albuterol causes some shakiness.   Today's spirometry showed restrictive disease with 11% improvement in FEV1 post bronchodilator treatment.   Asthma not well controlled with current regimen. Hopefull with new regimen and better control her asthma triggers won't be as bothersome. Discussed with patient that is no skin prick testing available for perfumes/scents.  . Daily controller medication(s): INCREASE Symbicort 2 puffs to TWICE per day with spacer and rinse mouth afterwards. Start Singulair (montelukast) 10mg  daily at night. Cautioned that in some children/adults can experience behavioral changes including hyperactivity, agitation, depression, sleep disturbances and suicidal ideations. These side effects are rare, but if you notice them you should notify me and discontinue Singulair (montelukast). . May use LEVOalbuterol rescue inhaler 2 puffs or nebulizer every 4 to 6 hours as needed for shortness of breath, chest tightness, coughing, and wheezing. May use LEVOalbuterol rescue inhaler 2 puffs 5 to 15 minutes prior to strenuous physical activities. Monitor frequency of use.   Rx sent in for Levoalbuterol due to side effects from albuterol.  Start antibiotics and  prednisone as per your PCP.   Repeat spirometry at next visit.  Other allergic rhinitis Mild rhino  conjunctivitis symptoms. Used to take Claritin and Singulair but now on allegra and Flonase. AIT as a child but not sure for how long. 2021 skin testing at outside facility was positive to grass, trees, cats per patent report - in process of obtaining records.  Continue environmental control measures.  Start Singulair (montelukast) 10mg  daily at night.  May use over the counter antihistamines such as Allegra (fexofenadine) daily.   May use Flonase (fluticasone) nasal spray 1 spray per nostril twice a day as needed for nasal congestion.   Nasal saline spray (i.e., Simply Saline) or nasal saline lavage (i.e., NeilMed) is recommended as needed and prior to medicated nasal sprays.  Return in about 4 weeks (around 05/17/2020).  ADDENDUM - Spirometry was performed with wrong date of service which may have skewed the results. This was not realized until after the visit.   Meds ordered this encounter  Medications  . montelukast (SINGULAIR) 10 MG tablet    Sig: Take 1 tablet (10 mg total) by mouth at bedtime.    Dispense:  30 tablet    Refill:  5  . levalbuterol (XOPENEX HFA) 45 MCG/ACT inhaler    Sig: Inhale 2 puffs into the lungs every 4 (four) hours as needed for wheezing or shortness of breath.    Dispense:  1 each    Refill:  2    Albuterol caused shaking  . levalbuterol (XOPENEX) 1.25 MG/3ML nebulizer solution    Sig: Take 1.25 mg by nebulization every 4 (four) hours as needed for wheezing or shortness of breath.    Dispense:  75 mL    Refill:  2    Albuterol caused shaking   Lab Orders  No laboratory test(s) ordered today    Other allergy screening: Rhino conjunctivitis: yes  Mild rhinorrhea, itchy watery/eyes with the pollen.  Takes allegra as needed with some benefit. Takes Flonase 2 sprays per nostril 1-2 times prn with good benefit. No nosebleeds.  Patient was on allergy injections as a child but not sure how long she was on it.  Patient used to take Claritin and Singulair.    Food allergy: no Medication allergy: yes  Aleve - hives Hymenoptera allergy: no Urticaria: no Eczema:no History of recurrent infections suggestive of immunodeficency: no  Diagnostics: Spirometry:  Tracings reviewed. Her effort: Good reproducible efforts. FVC: 2.10L FEV1: 1.71L, 75% predicted FEV1/FVC ratio: 81% Interpretation: Spirometry consistent with possible restrictive disease with 11% improvement in FEV1 post bronchodilator treatment.  Please see scanned spirometry results for details.  Past Medical History: Patient Active Problem List   Diagnosis Date Noted  . Other allergic rhinitis 04/19/2020  . Allergic conjunctivitis of both eyes 04/19/2020  . Asthma, not well controlled, unspecified asthma severity, uncomplicated 09/05/2010  . Seasonal allergies 09/05/2010  . Birth control 09/05/2010   Past Medical History:  Diagnosis Date  . Asthma    controlled on symbicort, never hospitalized, never intubated  . Birth control    on tri-norinyl  . Seasonal allergies    on allegra, nasonex   Past Surgical History: History reviewed. No pertinent surgical history. Medication List:  Current Outpatient Medications  Medication Sig Dispense Refill  . budesonide-formoterol (SYMBICORT) 160-4.5 MCG/ACT inhaler Inhale 2 puffs into the lungs 2 (two) times daily as needed (SOB).    . fexofenadine (ALLEGRA) 180 MG tablet Take 180 mg by mouth daily.    . fluticasone (  FLONASE) 50 MCG/ACT nasal spray Place 2 sprays into both nostrils daily.    . Ibuprofen 200 MG CAPS Take 400 mg by mouth every 6 (six) hours as needed (cramping).    Marland Kitchen levalbuterol (XOPENEX HFA) 45 MCG/ACT inhaler Inhale 2 puffs into the lungs every 4 (four) hours as needed for wheezing or shortness of breath. 1 each 2  . levalbuterol (XOPENEX) 1.25 MG/3ML nebulizer solution Take 1.25 mg by nebulization every 4 (four) hours as needed for wheezing or shortness of breath. 75 mL 2  . montelukast (SINGULAIR) 10 MG tablet Take  1 tablet (10 mg total) by mouth at bedtime. 30 tablet 5  . phentermine (ADIPEX-P) 37.5 MG tablet Take 37.5 mg by mouth daily before breakfast.    . azithromycin (ZITHROMAX) 250 MG tablet Take 1 tablet (250 mg total) by mouth daily. Take first 2 tablets together, then 1 every day until finished. (Patient not taking: Reported on 04/19/2020) 6 tablet 0  . predniSONE (DELTASONE) 20 MG tablet Take 3 tablets (60 mg total) by mouth daily. (Patient not taking: No sig reported) 15 tablet 0   No current facility-administered medications for this visit.   Allergies: Allergies  Allergen Reactions  . Aleve [Naproxen Sodium] Hives   Social History: Social History   Socioeconomic History  . Marital status: Single    Spouse name: Not on file  . Number of children: Not on file  . Years of education: in college  . Highest education level: Not on file  Occupational History  . Occupation: student    Comment: [employer]ologist  Tobacco Use  . Smoking status: Never Smoker  . Smokeless tobacco: Never Used  Substance and Sexual Activity  . Alcohol use: No  . Drug use: No  . Sexual activity: Not on file  Other Topics Concern  . Not on file  Social History Narrative  . Not on file   Social Determinants of Health   Financial Resource Strain: Not on file  Food Insecurity: Not on file  Transportation Needs: Not on file  Physical Activity: Not on file  Stress: Not on file  Social Connections: Not on file   Lives in a 29 year old apartment. Smoking: denies Occupation: bus Interior and spatial designer HistorySurveyor, minerals in the house: no Carpet in the family room: yes Carpet in the bedroom: yes Heating: electric Cooling: central Pet: no  Family History: Family History  Problem Relation Age of Onset  . Hypertension Mother   . Asthma Unknown   . Allergies Unknown    Review of Systems  Constitutional: Negative for appetite change, chills, fever and unexpected weight  change.  HENT: Negative for congestion and rhinorrhea.   Eyes: Negative for itching.  Respiratory: Positive for cough, chest tightness, shortness of breath and wheezing.   Cardiovascular: Negative for chest pain.  Gastrointestinal: Negative for abdominal pain.  Genitourinary: Negative for difficulty urinating.  Skin: Negative for rash.  Allergic/Immunologic: Positive for environmental allergies.  Neurological: Negative for headaches.   Objective: BP 118/86   Pulse 80   Temp (!) 97.4 F (36.3 C) (Temporal)   Resp 18   Ht 5\' 4"  (1.626 m)   Wt 247 lb (112 kg)   SpO2 98%   BMI 42.40 kg/m  Body mass index is 42.4 kg/m. Physical Exam Vitals and nursing note reviewed.  Constitutional:      Appearance: Normal appearance. She is well-developed.  HENT:     Head: Normocephalic and atraumatic.  Right Ear: External ear normal.     Left Ear: External ear normal.     Nose: Nose normal.     Mouth/Throat:     Mouth: Mucous membranes are moist.     Pharynx: Oropharynx is clear.  Eyes:     Conjunctiva/sclera: Conjunctivae normal.  Cardiovascular:     Rate and Rhythm: Normal rate and regular rhythm.     Heart sounds: Normal heart sounds. No murmur heard. No friction rub. No gallop.   Pulmonary:     Effort: Pulmonary effort is normal.     Breath sounds: Normal breath sounds. No wheezing, rhonchi or rales.  Abdominal:     Palpations: Abdomen is soft.  Musculoskeletal:     Cervical back: Neck supple.  Skin:    General: Skin is warm.     Findings: No rash.  Neurological:     Mental Status: She is alert and oriented to person, place, and time.  Psychiatric:        Behavior: Behavior normal.    The plan was reviewed with the patient/family, and all questions/concerned were addressed.  It was my pleasure to see Syreeta today and participate in her care. Please feel free to contact me with any questions or concerns.  Sincerely,  Wyline MoodYoon Rollin Kotowski, DO Allergy & Immunology  [facility] [place] office: [phone] [facility] [place] [place] office: [phone]

## 2020-04-25 ENCOUNTER — Encounter: Payer: Self-pay | Admitting: Allergy

## 2020-04-25 NOTE — Progress Notes (Signed)
Reviewed notes from [facility] - Dr. Caffie Damme (family medicine)  03/13/2019 skin testing was positive to grass pollen, mold, weed pollen, tree pollen, feather, ragweed pollen, cat, cockroach, dog, horse, dust mites, mouse.  Please see scanned results.

## 2020-05-12 ENCOUNTER — Emergency Department (HOSPITAL_COMMUNITY)
Admission: EM | Admit: 2020-05-12 | Discharge: 2020-05-12 | Disposition: A | Discharge status: Home / Self Care | Payer: BC Managed Care – PPO | Attending: Emergency Medicine | Admitting: Emergency Medicine

## 2020-05-12 DIAGNOSIS — J45909 Unspecified asthma, uncomplicated: Secondary | ICD-10-CM | POA: Insufficient documentation

## 2020-05-12 DIAGNOSIS — U071 COVID-19: Secondary | ICD-10-CM | POA: Insufficient documentation

## 2020-05-12 DIAGNOSIS — M79662 Pain in left lower leg: Secondary | ICD-10-CM | POA: Diagnosis not present

## 2020-05-12 DIAGNOSIS — M7989 Other specified soft tissue disorders: Secondary | ICD-10-CM | POA: Diagnosis not present

## 2020-05-12 DIAGNOSIS — R509 Fever, unspecified: Secondary | ICD-10-CM | POA: Diagnosis present

## 2020-05-12 DIAGNOSIS — Z79899 Other long term (current) drug therapy: Secondary | ICD-10-CM | POA: Diagnosis not present

## 2020-05-12 MED ORDER — ENOXAPARIN SODIUM 300 MG/3ML IJ SOLN
1.5000 mg/kg | Freq: Once | INTRAMUSCULAR | Status: AC
  Administered 2020-05-12: 170 mg via SUBCUTANEOUS
  Filled 2020-05-12: qty 1.7

## 2020-05-12 NOTE — Discharge Instructions (Addendum)
As we discussed, you need to come back tomorrow for an ultrasound.  We treated you with a blood thinner tonight to cover in case you have a blood clot in your left leg.  If you start  having chest pain or trouble breathing overnight, you should come back right away.  I have placed a referral to the outpatient COVID treatment center who can determine If you meet criteria for treatments that are available.  If you don't hear from them by tomorrow afternoon, please call your PCP.    When you return to work on 5/2, you must wear a mask for 5 days and you must be without fevers and your symptoms should be improving.

## 2020-05-12 NOTE — ED Provider Notes (Signed)
Emergency Medicine Provider Triage Evaluation Note  Joy Powers, a 30 y.o. female evaluated in triage.  Pt here stating she needs the antibody infusion. Tested positive for covid on Monday, symptoms began same day. Received covid vaccines. Hx asthma, not SOB currently.   BP 119/85 (BP Location: Left Arm)   Pulse 84   Temp 98.5 F (36.9 C) (Oral)   Resp 14   SpO2 100%   Patient is alert, no acute distress, normal work of breathing    Medically screening exam initiated at 7:42 PM. Appropriate orders placed.  Darryl D Crowl was informed that the remainder of the evaluation will be completed by another provider, this initial triage assessment does not replace that evaluation, and the importance of remaining in the ED until their evaluation is complete.       Anurag Scarfo, [place] N, PA-C 05/12/20 Carmelina Paddock, MD 05/16/20 [phone]

## 2020-05-12 NOTE — ED Provider Notes (Signed)
[facility]: [name] EMERGENCY DEPARTMENT Provider Note   CSN: [id number] Arrival date & time: 05/12/20  1844     History Chief Complaint  Patient presents with  . Covid Positive    Joy Powers is a 30 y.o. female.  Patient is a 30 year old female who presents withIntermittent subjective fevers, chills, overall feeling unwell, congestion, diagnosed with COVID on 4/25, also day of symptom onset She has a history of asthma and was advised by the urgent care to come to the ED for monoclonal antibody infusion She denies any shortness of breath States that she has been intermittently wheezing, but is doing well currently Denies any chest pain States that a few nights ago after being diagnosed with COVID, she did notice worsening swelling of her left calf, she elevated it and it went away She continues to have some pain in her left calf No prior history of blood clots        Past Medical History:  Diagnosis Date  . Asthma    controlled on symbicort, never hospitalized, never intubated  . Birth control    on tri-norinyl  . Seasonal allergies    on allegra, nasonex    Patient Active Problem List   Diagnosis Date Noted  . Other allergic rhinitis 04/19/2020  . Allergic conjunctivitis of both eyes 04/19/2020  . Asthma, not well controlled, unspecified asthma severity, uncomplicated 09/05/2010  . Seasonal allergies 09/05/2010  . Birth control 09/05/2010    No past surgical history on file.   OB History   No obstetric history on file.     Family History  Problem Relation Age of Onset  . Hypertension Mother   . Asthma Unknown   . Allergies Unknown     Social History   Tobacco Use  . Smoking status: Never Smoker  . Smokeless tobacco: Never Used  Substance Use Topics  . Alcohol use: No  . Drug use: No    Home Medications Prior to Admission medications   Medication Sig Start Date End Date Taking? Authorizing Provider  azithromycin (ZITHROMAX)  250 MG tablet Take 1 tablet (250 mg total) by mouth daily. Take first 2 tablets together, then 1 every day until finished. Patient not taking: Reported on 04/19/2020 04/15/15   Melton Krebs, PA-C  budesonide-formoterol [facility]) 160-4.5 MCG/ACT inhaler Inhale 2 puffs into the lungs 2 (two) times daily as needed (SOB).    [provider]  fexofenadine (ALLEGRA) 180 MG tablet Take 180 mg by mouth daily.    [provider]  fluticasone (FLONASE) 50 MCG/ACT nasal spray Place 2 sprays into both nostrils daily.    [provider]  Ibuprofen 200 MG CAPS Take 400 mg by mouth every 6 (six) hours as needed (cramping).    [provider]  levalbuterol Pauline Aus [facility]) 45 MCG/ACT inhaler Inhale 2 puffs into the lungs every 4 (four) hours as needed for wheezing or shortness of breath. 04/19/20   Ellamae Sia, DO  levalbuterol Pauline Aus) 1.25 MG/3ML nebulizer solution Take 1.25 mg by nebulization every 4 (four) hours as needed for wheezing or shortness of breath. 04/19/20   Ellamae Sia, DO  montelukast (SINGULAIR) 10 MG tablet Take 1 tablet (10 mg total) by mouth at bedtime. 04/19/20   Ellamae Sia, DO  phentermine (ADIPEX-P) 37.5 MG tablet Take 37.5 mg by mouth daily before breakfast.    [provider]  predniSONE (DELTASONE) 20 MG tablet Take 3 tablets (60 mg total) by mouth daily. Patient  not taking: No sig reported 04/15/15   Melton Krebs, PA-C    Allergies    Aleve [naproxen sodium]  Review of Systems   Review of Systems  Constitutional: Positive for chills and fever.  HENT: Positive for congestion.   Eyes: Negative for visual disturbance.  Respiratory: Positive for cough and wheezing. Negative for shortness of breath.   Cardiovascular: Positive for leg swelling (left). Negative for chest pain and palpitations.  Gastrointestinal: Negative for abdominal pain, diarrhea, nausea and vomiting.  Genitourinary: Negative for decreased urine volume,  difficulty urinating and dysuria.  Musculoskeletal: Negative for arthralgias.  Skin: Negative for rash.  Neurological: Negative for dizziness, syncope and weakness.    Physical Exam Updated Vital Signs BP (!) 135/100   Pulse 86   Temp 98.6 F (37 C) (Oral)   Resp 19   SpO2 99%   Physical Exam Constitutional:      General: She is not in acute distress.    Appearance: Normal appearance. She is not ill-appearing or toxic-appearing.  HENT:     Head: Normocephalic and atraumatic.     Mouth/Throat:     Mouth: Mucous membranes are moist.  Eyes:     Conjunctiva/sclera: Conjunctivae normal.  Cardiovascular:     Rate and Rhythm: Normal rate and regular rhythm.  Pulmonary:     Effort: Pulmonary effort is normal.     Breath sounds: Normal breath sounds. No wheezing, rhonchi or rales.  Abdominal:     Palpations: Abdomen is soft.  Musculoskeletal:        General: Tenderness (calf squeeze on left) present. Normal range of motion.     Right lower leg: No edema.     Left lower leg: No edema.  Skin:    General: Skin is warm and dry.     Capillary Refill: Capillary refill takes less than 2 seconds.     Findings: No erythema, lesion or rash.  Neurological:     General: No focal deficit present.     Mental Status: She is alert and oriented to person, place, and time.  Psychiatric:        Mood and Affect: Mood normal.        Behavior: Behavior normal.     ED Results / Procedures / Treatments   Labs (all labs ordered are listed, but only abnormal results are displayed) Labs Reviewed - No data to display  EKG None  Radiology No results found.  Procedures Procedures   Medications Ordered in ED Medications  enoxaparin (LOVENOX) 100 mg/mL injection 170 mg (has no administration in time range)    ED Course  I have reviewed the triage vital signs and the nursing notes.  Pertinent labs & imaging results that were available during my care of the patient were reviewed by me and  considered in my medical decision making (see chart for details).    MDM Rules/Calculators/A&P                          Patient is a 30 year old with past medical history significant for asthma, who presents with diagnosed COVID on 4/25. She has been feeling overall well, however has asthma and was advised that she should come to the ED for monoclonal antibody infusion.  Discussed with the patient that this is not available in the emergency department.  Vital signs are stable, she is breathing comfortably on room air, lungs are clear on examination.  Can refer patient to outpatient  COVID treatment to see if she would meet criteria for any treatments available.  Also advised her that she can contact PCP tomorrow to further discuss her treatment options.  She does not currently meet inpatient criteria for COVID-19.  She does however note that she had some transient swelling of her left lower extremity and continues to have some calf tenderness.  Given increased risk of clotting with COVID-19, and cannot order DVT US tonight, will give lovenox to cover for treatment and have patient return tomorrow for DVT US.  She was discharge home in stable condition.  Referred to COVID treatment on discharge.   Final Clinical Impression(s) / ED Diagnoses Final diagnoses:  COVID-19 virus infection  Left leg swelling    Rx / DC Orders ED Discharge Orders         Ordered    Ambulatory referral for Covid Treatment        05/12/20 2303    [facility]        05/12/20 2306           Reanne Nellums, Solmon Ice, DO 05/12/20 2310    Gwyneth Sprout, MD 05/16/20 [phone]

## 2020-05-12 NOTE — ED Notes (Signed)
Patient given discharge paperwork and instructions. Verbalized understanding of teaching. No IV access. Ambulatory to exit in NAD with steady gait. 

## 2020-05-12 NOTE — ED Triage Notes (Signed)
Pt reports she needs a antibody infusion and states her covid symptoms started on Monday and was tested positive on Monday

## 2020-05-13 ENCOUNTER — Ambulatory Visit (HOSPITAL_COMMUNITY)
Admission: RE | Admit: 2020-05-13 | Discharge: 2020-05-13 | Disposition: A | Discharge status: Home / Self Care | Payer: BC Managed Care – PPO | Source: Ambulatory Visit | Attending: Emergency Medicine | Admitting: Emergency Medicine

## 2020-05-13 ENCOUNTER — Ambulatory Visit (HOSPITAL_COMMUNITY): Admission: RE | Admit: 2020-05-13 | Payer: BC Managed Care – PPO | Source: Ambulatory Visit

## 2020-05-13 ENCOUNTER — Other Ambulatory Visit: Payer: Self-pay

## 2020-05-13 DIAGNOSIS — M79605 Pain in left leg: Secondary | ICD-10-CM

## 2020-05-13 DIAGNOSIS — U071 COVID-19: Secondary | ICD-10-CM | POA: Diagnosis not present

## 2020-05-13 DIAGNOSIS — M7989 Other specified soft tissue disorders: Secondary | ICD-10-CM | POA: Diagnosis present

## 2020-05-13 NOTE — Progress Notes (Signed)
LLE venous duplex  has been completed.  Results can be found under chart review under CV PROC. 05/13/2020 4:54 PM Nakkia Mackiewicz RVT, RDMS

## 2020-05-14 ENCOUNTER — Telehealth: Payer: Self-pay | Admitting: Physician Assistant

## 2020-05-14 NOTE — Telephone Encounter (Signed)
Called to discuss with Joy Powers about Covid symptoms and the use of bebtelivomab, remdisivir or oral therapies for those with mild to moderate Covid symptoms and at a high risk of hospitalization.    Pt does not qualify as pt's symptoms first presented > 5 days so out of window for oral therapy and would be >7 days from symptom onset by the time we have infusion availability. Symptoms tier reviewed as well as criteria for ending isolation. Preventative practices reviewed. Patient verbalized understanding     Patient Active Problem List   Diagnosis Date Noted  . Other allergic rhinitis 04/19/2020  . Allergic conjunctivitis of both eyes 04/19/2020  . Asthma, not well controlled, unspecified asthma severity, uncomplicated 09/05/2010  . Seasonal allergies 09/05/2010  . Birth control 09/05/2010    Cline Crock PA-C

## 2020-05-20 ENCOUNTER — Other Ambulatory Visit: Payer: Self-pay

## 2020-05-20 ENCOUNTER — Ambulatory Visit: Payer: BC Managed Care – PPO | Admitting: Allergy

## 2020-05-20 ENCOUNTER — Emergency Department (HOSPITAL_BASED_OUTPATIENT_CLINIC_OR_DEPARTMENT_OTHER)
Admission: EM | Admit: 2020-05-20 | Discharge: 2020-05-20 | Disposition: A | Discharge status: Home / Self Care | Payer: BC Managed Care – PPO | Attending: Emergency Medicine | Admitting: Emergency Medicine

## 2020-05-20 DIAGNOSIS — Z7951 Long term (current) use of inhaled steroids: Secondary | ICD-10-CM | POA: Insufficient documentation

## 2020-05-20 DIAGNOSIS — R000 Tachycardia, unspecified: Secondary | ICD-10-CM | POA: Insufficient documentation

## 2020-05-20 DIAGNOSIS — J45901 Unspecified asthma with (acute) exacerbation: Secondary | ICD-10-CM | POA: Diagnosis not present

## 2020-05-20 DIAGNOSIS — R0602 Shortness of breath: Secondary | ICD-10-CM | POA: Diagnosis present

## 2020-05-20 NOTE — ED Notes (Addendum)
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 Started @0637  - C/O SOB, nausea and vomiting. - EMS was called -  per EMS pt exhibiting anxiety . Pt used rescue inhaler 5 times - Lungs clear - VS stable on room air

## 2020-05-20 NOTE — Discharge Instructions (Signed)
Continue to use your rescue inhaler as needed.  Continue all your current asthma medications and allergy medications.  Return if your symptoms worsen.

## 2020-05-20 NOTE — ED Provider Notes (Signed)
[facility] EMERGENCY DEPARTMENT Provider Note   CSN: [id number] Arrival date & time: 05/20/20  0844     History Chief Complaint  Patient presents with  . Asthma  . Nausea  . Shortness of Breath    Joy Powers is a 30 y.o. female.  The history is provided by the patient.  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 The current episode started less than 1 hour ago. The problem occurs constantly. The problem has been rapidly improving. Associated symptoms include shortness of breath. Associated symptoms comments: Chest tightness and vomiting. Nothing aggravates the symptoms. Relieved by: Inhaler. Treatments tried: Inhaler. The treatment provided significant relief.  Shortness of Breath      Past Medical History:  Diagnosis Date  . Asthma    controlled on symbicort, never hospitalized, never intubated  . Birth control    on tri-norinyl  . Seasonal allergies    on allegra, nasonex    Patient Active Problem List   Diagnosis Date Noted  . Other allergic rhinitis 04/19/2020  . Allergic conjunctivitis of both eyes 04/19/2020  . Asthma, not well controlled, unspecified asthma severity, uncomplicated 09/05/2010  . Seasonal allergies 09/05/2010  . Birth control 09/05/2010    No past surgical history on file.   OB History   No obstetric history on file.     Family History  Problem Relation Age of Onset  . Hypertension Mother   . Asthma Unknown   . Allergies Unknown     Social History   Tobacco Use  . Smoking status: Never Smoker  . Smokeless tobacco: Never Used  Substance Use Topics  . Alcohol use: No  . Drug use: No    Home Medications Prior to Admission medications   Medication Sig Start Date End Date Taking? Authorizing Provider  fexofenadine (ALLEGRA) 180 MG tablet Take 180 mg by mouth daily.   Yes  [provider]  fluticasone (FLONASE) 50 MCG/ACT nasal spray Place 2 sprays into both nostrils daily.   Yes [provider]  levalbuterol (XOPENEX HFA) 45 MCG/ACT inhaler Inhale 2 puffs into the lungs every 4 (four) hours as needed for wheezing or shortness of breath. 04/19/20  Yes Ellamae Sia, DO  levalbuterol (XOPENEX) 1.25 MG/3ML nebulizer solution Take 1.25 mg by nebulization every 4 (four) hours as needed for wheezing or shortness of breath. 04/19/20  Yes Ellamae Sia, DO  montelukast (SINGULAIR) 10 MG tablet Take 1 tablet (10 mg total) by mouth at bedtime. 04/19/20  Yes Ellamae Sia, DO  azithromycin (ZITHROMAX) 250 MG tablet Take 1 tablet (250 mg total) by mouth daily. Take first 2 tablets together, then 1 every day until finished. Patient not taking: No sig reported 04/15/15   Melton Krebs, PA-C  budesonide-formoterol [facility]) 160-4.5 MCG/ACT inhaler Inhale 2 puffs into the lungs 2 (two) times daily as needed (SOB).    [provider]  EPINEPHrine 0.3 mg/0.3 mL IJ SOAJ injection epinephrine 0.3 mg/0.3 mL injection, auto-injector    [provider]  etonogestrel (NEXPLANON) 68 MG IMPL implant Nexplanon 68 mg subdermal implant  Inject 1 implant by subcutaneous route.    [provider]  Ibuprofen 200 MG CAPS Take 400 mg by mouth every 6 (six) hours as needed (cramping).    [provider]  phentermine (ADIPEX-P) 37.5 MG tablet Take 37.5  mg by mouth daily before breakfast.    [provider]  predniSONE (DELTASONE) 20 MG tablet Take 3 tablets (60 mg total) by mouth daily. Patient not taking: No sig reported 04/15/15   Melton Krebs, PA-C    Allergies    Aleve [naproxen sodium]  Review of Systems   Review of Systems  Respiratory: Positive for shortness of breath.   All other systems reviewed and are negative.   Physical Exam Updated Vital Signs BP (!) 105/92 (BP Location: Right Arm)   Pulse 91   Temp 98.1 F  (36.7 C) (Oral)   Resp 17   Ht 5\' 4"  (1.626 m)   Wt 110.2 kg   SpO2 100%   BMI 41.71 kg/m   Physical Exam Vitals and nursing note reviewed.  Constitutional:      General: She is not in acute distress.    Appearance: Normal appearance. She is well-developed.  HENT:     Head: Normocephalic and atraumatic.     Right Ear: Tympanic membrane normal.     Left Ear: Tympanic membrane normal.     Nose: Congestion and rhinorrhea present.     Mouth/Throat:     Mouth: Mucous membranes are moist.  Eyes:     Pupils: Pupils are equal, round, and reactive to light.  Cardiovascular:     Rate and Rhythm: Regular rhythm. Tachycardia present.     Heart sounds: Normal heart sounds. No murmur heard. No friction rub.  Pulmonary:     Effort: Pulmonary effort is normal.     Breath sounds: Normal breath sounds. No wheezing or rales.  Musculoskeletal:        General: No tenderness. Normal range of motion.     Right lower leg: No edema.     Left lower leg: No edema.     Comments: No edema  Skin:    General: Skin is warm and dry.     Findings: No rash.  Neurological:     Mental Status: She is alert and oriented to person, place, and time. Mental status is at baseline.     Cranial Nerves: No cranial nerve deficit.     Sensory: No sensory deficit.     Motor: No weakness.  Psychiatric:        Mood and Affect: Mood normal.        Behavior: Behavior normal.     ED Results / Procedures / Treatments   Labs (all labs ordered are listed, but only abnormal results are displayed) Labs Reviewed - No data to display  EKG None  Radiology No results found.  Procedures Procedures   Medications Ordered in ED Medications - No data to display  ED Course  I have reviewed the triage vital signs and the nursing notes.  Pertinent labs & imaging results that were available during my care of the patient were reviewed by me and considered in my medical decision making (see chart for details).    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  No infectious sx, productive cough or other complaints.  Patient used her inhaler prior to arrival and upon arrival here the wheezing has resolved.  Patient currently is satting 100% on room air and denies feeling short of breath.  Patient was  observed for approximately 1 hour and symptoms have not returned.  She initially did have some vomiting but that has resolved and she is tolerating p.o.'s.  She has plenty of an inhaler to use at home.  Do not feel that steroids is warranted at this time.  Final Clinical Impression(s) / ED Diagnoses Final diagnoses:  Mild asthma with exacerbation, unspecified whether persistent    Rx / DC Orders ED Discharge Orders    None       Gwyneth Sprout, MD 05/20/20 1104

## 2020-05-20 NOTE — ED Notes (Signed)
MD @ BS

## 2020-06-22 NOTE — Progress Notes (Signed)
[address] Joy Powers [place] [address] Dept: [phone]  FOLLOW UP NOTE  Patient ID: Joy Powers, female    DOB: 15-Jan-1991  Age: 30 y.o. MRN: [id number] Date of Office Visit: 06/23/2020  Assessment  Chief Complaint: Asthma (Had an asthma attack on Sunday - used her rescue inhaler and used 2 vials for her neb treatment ) and Allergic Rhinitis  (Sneezing, coughing, itch water eyes due to asthma attack )  HPI Joy Powers is a 30 year old female who presents to the clinic for follow-up visit.  She was last seen in this clinic on 04/19/2020 by Dr. Selena Batten for evaluation of asthma and allergic rhinitis.  In the interim, she reports that she was at a large family gathering on Sunday where she was exposed to odors that began to flare her asthma.  She reports she took Benadryl and used her nebulizer treatments at that time in addition to her regular asthma medications.  She reports that 3 days later, on Tuesday, she went to see her primary care provider who gave her an injection of Depo-Medrol.  At today's visit, she reports her asthma has been poorly controlled over the last year with symptoms including frequent cough producing mucus and wheezing mostly occurring in the daytime when she is exposed to odors. She continues montelukast 10 mg once a day, Symbicort 160-2 puffs once a day with a spacer, and levalbuterol about twice a month.  She does report that on Tuesday, after receiving a steroid injection, she began to experience cough producing blood streaked brown mucus as well as brown nasal drainage. She reports the mucus has turned to yellow today and is no longer blood streaked. She reports some nasal congestion beginning on Sunday and denies sinus pain, pressure or headaches. Allergic rhinitis is reported as moderately well controlled with frequent clear rhinorrhea, nasal congestion, and occasional sneezing. She continues Allegra once a day and Flonase daily. She reports poor Flonase  application technique. She is not currently using a nasal saline rinse. She denies dry nostrils and rarely has epistaxis which has resolved in under 5 minutes.  Her current medications are listed in the chart.   Drug Allergies:  Allergies  Allergen Reactions   Aleve [Naproxen Sodium] Hives    Physical Exam: BP 118/76   Pulse 80   Temp 98 F (36.7 C)   Resp 18   Ht 5\' 2"  (1.575 m)   Wt 242 lb 3.2 oz (109.9 kg)   SpO2 97%   BMI 44.30 kg/m    Physical Exam Vitals reviewed.  Constitutional:      Appearance: Normal appearance.  HENT:     Head: Normocephalic and atraumatic.     Right Ear: Tympanic membrane normal.     Left Ear: Tympanic membrane normal.     Nose:     Comments: Bilateral nares slightly erythematous with clear nasal drainage noted.  Pharynx normal.  Ears normal.  Eyes normal.    Mouth/Throat:     Pharynx: Oropharynx is clear.  Eyes:     Conjunctiva/sclera: Conjunctivae normal.  Cardiovascular:     Rate and Rhythm: Normal rate and regular rhythm.     Heart sounds: Normal heart sounds. No murmur heard. Pulmonary:     Effort: Pulmonary effort is normal.     Breath sounds: Normal breath sounds.     Comments: Lungs clear to auscultation Musculoskeletal:        General: Normal range of motion.     Cervical back: Normal  range of motion and neck supple.  Skin:    General: Skin is warm and dry.  Neurological:     Mental Status: She is alert and oriented to person, place, and time.  Psychiatric:        Mood and Affect: Mood normal.        Behavior: Behavior normal.        Thought Content: Thought content normal.        Judgment: Judgment normal.    Diagnostics: FVC 2.03, FEV1 1.60.  Predicted FVC 2.98, predicted FEV1 2.56.  Spirometry indicates mild restriction.  Postbronchodilator FVC 2.43, FEV1 2.14.  Postbronchodilator spirometry indicates normal ventilatory function with 20% improvement in FVC and 34% improvement in FEV1.  Assessment and Plan: 1. Not  well controlled moderate persistent asthma with acute exacerbation   2. Cough   3. Seasonal and perennial allergic rhinitis   4. Allergic conjunctivitis of both eyes     Meds ordered this encounter  Medications   montelukast (SINGULAIR) 10 MG tablet    Sig: Take 1 tablet (10 mg total) by mouth at bedtime.    Dispense:  30 tablet    Refill:  5   levalbuterol (XOPENEX HFA) 45 MCG/ACT inhaler    Sig: Inhale 2 puffs into the lungs every 4 (four) hours as needed for wheezing or shortness of breath.    Dispense:  1 each    Refill:  1    Albuterol caused shaking   Fluticasone-Umeclidin-Vilant (TRELEGY ELLIPTA) 200-62.5-25 MCG/INH AEPB    Sig: Inhale 1 puff into the lungs daily.    Dispense:  1 each    Refill:  5    Patient Instructions  Cough Get a chest xray. We will call you when we get the results  Asthma Continue montelukast 10 mg once a day to prevent cough or wheeze Begin Trelegy 200-1 puff once a day to prevent cough or wheeze. This will replace Symbicort 160 Continue Xopenex 2 puffs once every 4-6 hours as needed for cough or wheeze OR instead Xopenex via nebulizer once every 4-6 hours as needed  Allergic rhinitis Continue allergen avoidance measures directed toward grass pollen, tree pollen, weed pollen, ragweed pollen, mold, cat, dog, dust mites, cockroach, mouse, and feathers as listed below Continue an over the counter antihistamine once a day as needed for a runny nose or itch Continue Flonase 2 sprays in each nostril once a day as needed for a stuffy nose.  In the right nostril, point the applicator out toward the right ear. In the left nostril, point the applicator out toward the left ear Consider saline nasal rinses as needed for nasal symptoms. Use this before any medicated nasal sprays for best result   Allergic conjunctivitis Some over the counter eye drops include Pataday one drop in each eye once a day as needed for red, itchy eyes OR Zaditor one drop in each eye  twice a day as needed for red itchy eyes.  Call the clinic if this treatment plan is not working well for you  Follow up in 2 weeks or sooner if needed.   Return in about 2 weeks (around 07/07/2020), or if symptoms worsen or fail to improve.    Thank you for the opportunity to care for this patient.  Please do not hesitate to contact me with questions.  Thermon Leyland, FNP [facility]

## 2020-06-22 NOTE — Patient Instructions (Addendum)
Cough Get a chest xray. We will call you when we get the results  Asthma Continue montelukast 10 mg once a day to prevent cough or wheeze Begin Trelegy 200-1 puff once a day to prevent cough or wheeze. This will replace Symbicort 160 Continue Xopenex 2 puffs once every 4-6 hours as needed for cough or wheeze OR instead Xopenex via nebulizer once every 4-6 hours as needed  Allergic rhinitis Continue allergen avoidance measures directed toward grass pollen, tree pollen, weed pollen, ragweed pollen, mold, cat, dog, dust mites, cockroach, mouse, and feathers as listed below Continue an over the counter antihistamine once a day as needed for a runny nose or itch Continue Flonase 2 sprays in each nostril once a day as needed for a stuffy nose.  In the right nostril, point the applicator out toward the right ear. In the left nostril, point the applicator out toward the left ear Consider saline nasal rinses as needed for nasal symptoms. Use this before any medicated nasal sprays for best result  Allergic conjunctivitis Some over the counter eye drops include Pataday one drop in each eye once a day as needed for red, itchy eyes OR Zaditor one drop in each eye twice a day as needed for red itchy eyes.  Call the clinic if this treatment plan is not working well for you  Follow up in 2 weeks or sooner if needed.  Reducing Pollen Exposure The American Academy of Allergy, Asthma and Immunology suggests the following steps to reduce your exposure to pollen during allergy seasons. Do not hang sheets or clothing out to dry; pollen may collect on these items. Do not mow lawns or spend time around freshly cut grass; mowing stirs up pollen. Keep windows closed at night.  Keep car windows closed while driving. Minimize morning activities outdoors, a time when pollen counts are usually at their highest. Stay indoors as much as possible when pollen counts or humidity is high and on windy days when pollen tends  to remain in the air longer. Use air conditioning when possible.  Many air conditioners have filters that trap the pollen spores. Use a HEPA room air filter to remove pollen form the indoor air you breathe.  Control of Mold Allergen Mold and fungi can grow on a variety of surfaces provided certain temperature and moisture conditions exist.  Outdoor molds grow on plants, decaying vegetation and soil.  The major outdoor mold, Alternaria and Cladosporium, are found in very high numbers during hot and dry conditions.  Generally, a late Summer - Fall peak is seen for common outdoor fungal spores.  Rain will temporarily lower outdoor mold spore count, but counts rise rapidly when the rainy period ends.  The most important indoor molds are Aspergillus and Penicillium.  Dark, humid and poorly ventilated basements are ideal sites for mold growth.  The next most common sites of mold growth are the bathroom and the kitchen.  Outdoor [employer] Use air conditioning and keep windows closed Avoid exposure to decaying vegetation. Avoid leaf raking. Avoid grain handling. Consider wearing a face mask if working in moldy areas.  Indoor Mold Control Maintain humidity below 50%. Clean washable surfaces with 5% bleach solution. Remove sources e.g. Contaminated carpets.   Control of Dust Mite Allergen Dust mites play a major role in allergic asthma and rhinitis. They occur in environments with high humidity wherever human skin is found. Dust mites absorb humidity from the atmosphere (ie, they do not drink) and feed on organic  matter (including shed human and animal skin). Dust mites are a microscopic type of insect that you cannot see with the naked eye. High levels of dust mites have been detected from mattresses, pillows, carpets, upholstered furniture, bed covers, clothes, soft toys and any woven material. The principal allergen of the dust mite is found in its feces. A gram of dust may contain 1,000 mites and  250,000 fecal particles. Mite antigen is easily measured in the air during house cleaning activities. Dust mites do not bite and do not cause harm to humans, other than by triggering allergies/asthma.  Ways to decrease your exposure to dust mites in your home:  1. Encase mattresses, box springs and pillows with a mite-impermeable barrier or cover  2. Wash sheets, blankets and drapes weekly in hot water (130 F) with detergent and dry them in a dryer on the hot setting.  3. Have the room cleaned frequently with a vacuum cleaner and a damp dust-mop. For carpeting or rugs, vacuuming with a vacuum cleaner equipped with a high-efficiency particulate air (HEPA) filter. The dust mite allergic individual should not be in a room which is being cleaned and should wait 1 hour after cleaning before going into the room.  4. Do not sleep on upholstered furniture (eg, couches).  5. If possible removing carpeting, upholstered furniture and drapery from the home is ideal. Horizontal blinds should be eliminated in the rooms where the person spends the most time (bedroom, study, television room). Washable vinyl, roller-type shades are optimal.  6. Remove all non-washable stuffed toys from the bedroom. Wash stuffed toys weekly like sheets and blankets above.  7. Reduce indoor humidity to less than 50%. Inexpensive humidity monitors can be purchased at most hardware stores. Do not use a humidifier as can make the problem worse and are not recommended.  Control of Dog or Cat Allergen Avoidance is the best way to manage a dog or cat allergy. If you have a dog or cat and are allergic to dog or cats, consider removing the dog or cat from the home. If you have a dog or cat but don't want to find it a new home, or if your family wants a pet even though someone in the household is allergic, here are some strategies that may help keep symptoms at bay:  Keep the pet out of your bedroom and restrict it to only a few rooms.  Be advised that keeping the dog or cat in only one room will not limit the allergens to that room. Don't pet, hug or kiss the dog or cat; if you do, wash your hands with soap and water. High-efficiency particulate air (HEPA) cleaners run continuously in a bedroom or living room can reduce allergen levels over time. Regular use of a high-efficiency vacuum cleaner or a central vacuum can reduce allergen levels. Giving your dog or cat a bath at least once a week can reduce airborne allergen.  Control of Cockroach Allergen  Cockroach allergen has been identified as an important cause of acute attacks of asthma, especially in urban settings.  There are fifty-five species of cockroach that exist in the [place], however only three, the [place], [place] species produce allergen that can affect patients with Asthma.  Allergens can be obtained from fecal particles, egg casings and secretions from cockroaches.    Remove food sources. Reduce access to water. Seal access and entry points. Spray runways with 0.5-1% Diazinon or Chlorpyrifos Blow boric acid power under stoves and  refrigerator. Place bait stations (hydramethylnon) at feeding sites.

## 2020-06-23 ENCOUNTER — Encounter: Payer: Self-pay | Admitting: Family Medicine

## 2020-06-23 ENCOUNTER — Ambulatory Visit: Payer: BC Managed Care – PPO | Admitting: Family Medicine

## 2020-06-23 ENCOUNTER — Other Ambulatory Visit: Payer: Self-pay

## 2020-06-23 VITALS — BP 118/76 | HR 80 | Temp 98.0°F | Resp 18 | Ht 62.0 in | Wt 242.2 lb

## 2020-06-23 DIAGNOSIS — J3089 Other allergic rhinitis: Secondary | ICD-10-CM | POA: Diagnosis not present

## 2020-06-23 DIAGNOSIS — H1013 Acute atopic conjunctivitis, bilateral: Secondary | ICD-10-CM | POA: Diagnosis not present

## 2020-06-23 DIAGNOSIS — R059 Cough, unspecified: Secondary | ICD-10-CM

## 2020-06-23 DIAGNOSIS — J4541 Moderate persistent asthma with (acute) exacerbation: Secondary | ICD-10-CM

## 2020-06-23 DIAGNOSIS — J302 Other seasonal allergic rhinitis: Secondary | ICD-10-CM

## 2020-06-23 MED ORDER — TRELEGY ELLIPTA 200-62.5-25 MCG/INH IN AEPB: 1.0000 | INHALATION_SPRAY | Freq: Every day | RESPIRATORY_TRACT | 5 refills | Status: AC

## 2020-06-23 MED ORDER — MONTELUKAST SODIUM 10 MG PO TABS: 10.0000 mg | ORAL_TABLET | Freq: Every day | ORAL | 5 refills | Status: AC

## 2020-06-23 MED ORDER — LEVALBUTEROL TARTRATE 45 MCG/ACT IN AERO: 2.0000 | INHALATION_SPRAY | RESPIRATORY_TRACT | 1 refills | Status: DC | PRN

## 2020-12-14 ENCOUNTER — Ambulatory Visit: Payer: BC Managed Care – PPO | Admitting: Allergy

## 2021-01-05 NOTE — Progress Notes (Deleted)
FOLLOW UP Date of Service/Encounter:  01/05/21   Subjective:  Joy Powers (DOB: [birth year]) is a 30 y.o. female who [facility] in re-evaluation of the following: Moderate persistent asthma History obtained from: chart review and {Persons; PED relatives w/patient:19415::"patient"}.  For Review, LV was on 06/23/2020 with Thermon Leyland, FNP seen for moderate persistent asthma with acute exacerbation (started on Trelegy 200 in place of Symbicort, continued on montelukast and Xopenex).  A chest x-ray was ordered but not obtained.  Also seen for allergic rhinitis continued on Flonase and saline rinses and Pataday as needed.  FEV1 last visit 1.60 L with 20% improvement in FVC and 34% improvement in FEV1 postbronchodilator  Pertinent history/diagnostics: -Spirometry 06/23/2020: FEV1 1.60 L with 20% improvement in FVC and 34% improvement in FEV1 post bronchodilator 2018 chest x-ray: No acute cardiopulmonary processes seen; images personally reviewed Previous allergy testing positive to grass pollen, tree pollen, weed pollen, ragweed pollen, mold, cat, dog, dust mites, cockroach, mouse, and feathers   Today she presents for follow-up.   Allergies as of 01/11/2021       Reactions   Aleve [naproxen Sodium] Hives        Medication List        Accurate as of January 05, 2021  4:00 PM. If you have any questions, ask your nurse or doctor.          azithromycin 250 MG tablet Commonly known as: ZITHROMAX Take 1 tablet (250 mg total) by mouth daily. Take first 2 tablets together, then 1 every day until finished.   budesonide-formoterol 160-4.5 MCG/ACT inhaler Commonly known as: SYMBICORT Inhale 2 puffs into the lungs 2 (two) times daily as needed (SOB).   EPINEPHrine 0.3 mg/0.3 mL Soaj injection Commonly known as: EPI-PEN epinephrine 0.3 mg/0.3 mL injection, auto-injector   fexofenadine 180 MG tablet Commonly known as: ALLEGRA Take 180 mg  by mouth daily.   fluticasone 50 MCG/ACT nasal spray Commonly known as: FLONASE Place 2 sprays into both nostrils daily.   Ibuprofen 200 MG Caps Take 400 mg by mouth every 6 (six) hours as needed (cramping).   levalbuterol 1.25 MG/3ML nebulizer solution Commonly known as: Xopenex Take 1.25 mg by nebulization every 4 (four) hours as needed for wheezing or shortness of breath.   levalbuterol 45 MCG/ACT inhaler Commonly known as: Xopenex HFA Inhale 2 puffs into the lungs every 4 (four) hours as needed for wheezing or shortness of breath.   montelukast 10 MG tablet Commonly known as: Singulair Take 1 tablet (10 mg total) by mouth at bedtime.   Nexplanon 68 MG Impl implant Generic drug: etonogestrel Nexplanon 68 mg subdermal implant  Inject 1 implant by subcutaneous route.   phentermine 37.5 MG tablet Commonly known as: ADIPEX-P Take 37.5 mg by mouth daily before breakfast.   predniSONE 10 MG (21) Tbpk tablet Commonly known as: STERAPRED UNI-PAK 21 TAB prednisone 10 mg tablets in a dose pack   predniSONE 20 MG tablet Commonly known as: DELTASONE Take 3 tablets (60 mg total) by mouth daily.   Trelegy Ellipta 200-62.5-25 MCG/ACT Aepb Generic drug: Fluticasone-Umeclidin-Vilant Inhale 1 puff into the lungs daily.       Past Medical History:  Diagnosis Date   Asthma    controlled on symbicort, never hospitalized, never intubated   Birth control    on tri-norinyl   Seasonal allergies    on allegra, nasonex   No past surgical history on file. Otherwise, there have  been no changes to her past medical history, surgical history, family history, or social history.  ROS: All others negative except as noted per HPI.   Objective:  There were no vitals taken for this visit. There is no height or weight on file to calculate BMI. Physical Exam: General Appearance:  Alert, cooperative, no distress, appears stated age  Head:  Normocephalic, without obvious abnormality,  atraumatic  Eyes:  Conjunctiva clear, EOM's intact  Nose: Nares normal, {Blank multiple:19196:a:"***","hypertrophic turbinates","normal mucosa","no visible anterior polyps","septum midline"}  Throat: Lips, tongue normal; teeth and gums normal, {Blank multiple:19196:a:"***","normal posterior oropharynx","tonsils 2+","tonsils 3+","no tonsillar exudate","+ cobblestoning"}  Neck: Supple, symmetrical  Lungs:   {Blank multiple:19196:a:"***","clear to auscultation bilaterally","end-expiratory wheezing","wheezing throughout"}, Respirations unlabored, {Blank multiple:19196:a:"***","no coughing","intermittent dry coughing"}  Heart:  {Blank multiple:19196:a:"***","regular rate and rhythm","no murmur"}, Appears well perfused  Extremities: No edema  Skin: Skin color, texture, turgor normal, no rashes or lesions on visualized portions of skin  Neurologic: No gross deficits   Reviewed: ***  Spirometry:  Tracings reviewed. Her effort: {Blank single:19197::"Good reproducible efforts.","It was hard to get consistent efforts and there is a question as to whether this reflects a maximal maneuver.","Poor effort, data can not be interpreted.","Variable effort-results affected.","decent for first attempt at spirometry."} FVC: ***L FEV1: ***L, ***% predicted FEV1/FVC ratio: ***% Interpretation: {Blank single:19197::"Spirometry consistent with mild obstructive disease","Spirometry consistent with moderate obstructive disease","Spirometry consistent with severe obstructive disease","Spirometry consistent with possible restrictive disease","Spirometry consistent with mixed obstructive and restrictive disease","Spirometry uninterpretable due to technique","Spirometry consistent with normal pattern","No overt abnormalities noted given today's efforts"}.  Please see scanned spirometry results for details.  Skin Testing: {Blank single:19197::"Select foods","Environmental allergy panel","Environmental allergy panel and select  foods","Food allergy panel","None","Deferred due to recent antihistamines use"}. Positive test to: ***. Negative test to: ***.  Results discussed with patient/family.   {Blank single:19197::"Allergy testing results were read and interpreted by myself, documented by clinical staff."," "}  Assessment/Plan  There are no Patient Instructions on file for this visit.  Tonny Bollman, MD  [facility]

## 2021-01-11 ENCOUNTER — Ambulatory Visit: Payer: BC Managed Care – PPO | Admitting: Internal Medicine

## 2021-01-11 DIAGNOSIS — J309 Allergic rhinitis, unspecified: Secondary | ICD-10-CM

## 2021-04-28 ENCOUNTER — Emergency Department (HOSPITAL_COMMUNITY)
Admission: EM | Admit: 2021-04-28 | Discharge: 2021-04-29 | Disposition: A | Discharge status: Home / Self Care | Payer: BC Managed Care – PPO | Attending: Emergency Medicine | Admitting: Emergency Medicine

## 2021-04-28 ENCOUNTER — Encounter (HOSPITAL_COMMUNITY): Payer: Self-pay

## 2021-04-28 ENCOUNTER — Other Ambulatory Visit: Payer: Self-pay

## 2021-04-28 DIAGNOSIS — X038XXA Other exposure to controlled fire, not in building or structure, initial encounter: Secondary | ICD-10-CM | POA: Insufficient documentation

## 2021-04-28 DIAGNOSIS — T23201A Burn of second degree of right hand, unspecified site, initial encounter: Secondary | ICD-10-CM

## 2021-04-28 DIAGNOSIS — T23211A Burn of second degree of right thumb (nail), initial encounter: Secondary | ICD-10-CM | POA: Insufficient documentation

## 2021-04-28 DIAGNOSIS — T23202A Burn of second degree of left hand, unspecified site, initial encounter: Secondary | ICD-10-CM

## 2021-04-28 DIAGNOSIS — T23242A Burn of second degree of multiple left fingers (nail), including thumb, initial encounter: Secondary | ICD-10-CM | POA: Insufficient documentation

## 2021-04-28 DIAGNOSIS — J45909 Unspecified asthma, uncomplicated: Secondary | ICD-10-CM | POA: Insufficient documentation

## 2021-04-28 DIAGNOSIS — Z23 Encounter for immunization: Secondary | ICD-10-CM | POA: Insufficient documentation

## 2021-04-28 DIAGNOSIS — Y92019 Unspecified place in single-family (private) house as the place of occurrence of the external cause: Secondary | ICD-10-CM | POA: Insufficient documentation

## 2021-04-28 DIAGNOSIS — S6992XA Unspecified injury of left wrist, hand and finger(s), initial encounter: Secondary | ICD-10-CM | POA: Diagnosis present
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
## 2021-04-29 MED ORDER — ACETAMINOPHEN 500 MG PO TABS
1000.0000 mg | ORAL_TABLET | Freq: Once | ORAL | Status: AC
  Administered 2021-04-29: 1000 mg via ORAL
  Filled 2021-04-29: qty 2

## 2021-04-29 MED ORDER — TETANUS-DIPHTH-ACELL PERTUSSIS 5-2.5-18.5 LF-MCG/0.5 IM SUSY
0.5000 mL | PREFILLED_SYRINGE | Freq: Once | INTRAMUSCULAR | Status: AC
  Administered 2021-04-29: 0.5 mL via INTRAMUSCULAR
  Filled 2021-04-29: qty 0.5

## 2021-04-29 NOTE — ED Notes (Signed)
Pt verbalizes understanding of discharge instructions. Opportunity for questions and answers were provided. Pt discharged from the ED.   ?
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
## 2021-04-29 NOTE — Discharge Instructions (Signed)
For pain control you may take at 1000 mg of Tylenol every 8 hours as needed. 

## 2021-06-18 ENCOUNTER — Encounter (HOSPITAL_BASED_OUTPATIENT_CLINIC_OR_DEPARTMENT_OTHER): Payer: Self-pay

## 2021-06-18 ENCOUNTER — Emergency Department (HOSPITAL_BASED_OUTPATIENT_CLINIC_OR_DEPARTMENT_OTHER)
Admission: EM | Admit: 2021-06-18 | Discharge: 2021-06-18 | Disposition: A | Discharge status: Home / Self Care | Payer: BC Managed Care – PPO | Attending: Emergency Medicine | Admitting: Emergency Medicine

## 2021-06-18 ENCOUNTER — Other Ambulatory Visit: Payer: Self-pay

## 2021-06-18 DIAGNOSIS — S6992XA Unspecified injury of left wrist, hand and finger(s), initial encounter: Secondary | ICD-10-CM | POA: Diagnosis present

## 2021-06-18 DIAGNOSIS — S61412A Laceration without foreign body of left hand, initial encounter: Secondary | ICD-10-CM | POA: Diagnosis not present

## 2021-06-18 DIAGNOSIS — W268XXA Contact with other sharp object(s), not elsewhere classified, initial encounter: Secondary | ICD-10-CM | POA: Insufficient documentation

## 2021-06-18 DIAGNOSIS — S61012A Laceration without foreign body of left thumb without damage to nail, initial encounter: Secondary | ICD-10-CM

## 2021-06-18 MED ORDER — LIDOCAINE HCL (PF) 1 % IJ SOLN
5.0000 mL | Freq: Once | INTRAMUSCULAR | Status: AC
  Administered 2021-06-18: 5 mL via INTRADERMAL
  Filled 2021-06-18: qty 5

## 2021-06-18 NOTE — ED Notes (Signed)
Pt verbalizes understanding of discharge instructions. Opportunity for questioning and answers were provided. Pt discharged from ED to home.   ? ?

## 2021-06-18 NOTE — ED Provider Notes (Signed)
[facility] EMERGENCY DEPT Provider Note   CSN: [id number] Arrival date & time: 06/18/21  1731     History  Chief Complaint  Patient presents with   Laceration    Joy Powers is a 31 y.o. female.  Patient here with laceration to her left hand which was sustained while using a box cutter.  Occurred several hours prior to arrival.  Patient's tetanus status is current.  Denies any distal numbness or tingling to her thumb.  Applied direct pressure.      Home Medications Prior to Admission medications   Medication Sig Start Date End Date Taking? Authorizing Provider  fexofenadine (ALLEGRA) 180 MG tablet Take 180 mg by mouth daily.    [provider]  Fluticasone-Umeclidin-Vilant (TRELEGY ELLIPTA) 200-62.5-25 MCG/INH AEPB Inhale 1 puff into the lungs daily. 06/23/20   Hetty Blend, FNP  Ibuprofen 200 MG CAPS Take 400 mg by mouth every 6 (six) hours as needed (cramping).    [provider]  levalbuterol Pauline Aus HFA) 45 MCG/ACT inhaler Inhale 2 puffs into the lungs every 4 (four) hours as needed for wheezing or shortness of breath. Patient not taking: Reported on 04/29/2021 06/23/20   Hetty Blend, FNP  levalbuterol Pauline Aus) 1.25 MG/3ML nebulizer solution Take 1.25 mg by nebulization every 4 (four) hours as needed for wheezing or shortness of breath. Patient not taking: Reported on 04/29/2021 04/19/20   Ellamae Sia, DO  montelukast (SINGULAIR) 10 MG tablet Take 1 tablet (10 mg total) by mouth at bedtime. Patient not taking: Reported on 04/29/2021 06/23/20   Hetty Blend, FNP      Allergies    Aleve [naproxen sodium]    Review of Systems   Review of Systems  All other systems reviewed and are negative.  Physical Exam Updated Vital Signs BP 112/72   Pulse 86   Temp 97.8 F (36.6 C)   Resp 18   SpO2 100%  Physical Exam Vitals and nursing note reviewed.  Constitutional:      General: She is not in acute distress.    Appearance: Normal appearance.  She is well-developed. She is not toxic-appearing.  HENT:     Head: Normocephalic and atraumatic.  Eyes:     General: Lids are normal.     Conjunctiva/sclera: Conjunctivae normal.     Pupils: Pupils are equal, round, and reactive to light.  Neck:     Thyroid: No thyroid mass.     Trachea: No tracheal deviation.  Cardiovascular:     Rate and Rhythm: Normal rate and regular rhythm.     Heart sounds: Normal heart sounds. No murmur heard.   No gallop.  Pulmonary:     Effort: Pulmonary effort is normal. No respiratory distress.     Breath sounds: Normal breath sounds. No stridor. No decreased breath sounds, wheezing, rhonchi or rales.  Abdominal:     General: There is no distension.     Palpations: Abdomen is soft.     Tenderness: There is no abdominal tenderness. There is no rebound.  Musculoskeletal:        General: No tenderness. Normal range of motion.     Left hand: Laceration present.     Cervical back: Normal range of motion and neck supple.     Comments: 1 cm laceration noted on the dorsal surface of the hand the base of the thumb  Skin:    General: Skin is warm and dry.     Findings: No abrasion or  rash.  Neurological:     Mental Status: She is alert and oriented to person, place, and time. Mental status is at baseline.     GCS: GCS eye subscore is 4. GCS verbal subscore is 5. GCS motor subscore is 6.     Cranial Nerves: No cranial nerve deficit.     Sensory: No sensory deficit.     Motor: Motor function is intact.  Psychiatric:        Attention and Perception: Attention normal.        Speech: Speech normal.        Behavior: Behavior normal.    ED Results / Procedures / Treatments   Labs (all labs ordered are listed, but only abnormal results are displayed) Labs Reviewed - No data to display  EKG None  Radiology No results found.  Procedures Procedures    Medications Ordered in ED Medications  lidocaine (PF) (XYLOCAINE) 1 % injection 5 mL (5 mLs  Intradermal Given 06/18/21 2225)    ED Course/ Medical Decision Making/ A&P                           Medical Decision Making Risk Prescription drug management.   LACERATION REPAIR Performed by: Toy Baker Authorized by: Toy Baker Consent: Verbal consent obtained. Risks and benefits: risks, benefits and alternatives were discussed Consent given by: patient Patient identity confirmed: provided demographic data Prepped and Draped in normal sterile fashion Wound explored  Laceration Location: Left hand  Laceration Length: 1 cm  No Foreign Bodies seen or palpated  Anesthesia: local infiltration  Local anesthetic: lidocaine 1% 0 epinephrine  Anesthetic total: 2 ml  Irrigation method: syringe Amount of cleaning: standard  Skin closure: Simple interrupted sutures  Number of sutures: 2  Technique: Simple  Patient tolerance: Patient tolerated the procedure well with no immediate complications.  Laceration repaired as above.  No evidence of neurovascular compromise.  Patient be discharged home       Final Clinical Impression(s) / ED Diagnoses Final diagnoses:  None    Rx / DC Orders ED Discharge Orders     None         Lorre Nick, MD 06/18/21 2251

## 2021-06-18 NOTE — ED Triage Notes (Signed)
Pt presents POC for laceration to her Lef thumb. Pt reports she was cutting bands with a box cutter while talking and not "paying attention" to what she was doing. She accidentally cut her Left thumb. Area bandaged in triage, no blood noted

## 2021-06-18 NOTE — Discharge Instructions (Signed)
Sutures out in 7-10 days.

## 2021-10-12 ENCOUNTER — Other Ambulatory Visit: Payer: Self-pay | Admitting: Family Medicine

## 2023-03-30 ENCOUNTER — Other Ambulatory Visit: Payer: Self-pay

## 2023-03-30 ENCOUNTER — Encounter (HOSPITAL_BASED_OUTPATIENT_CLINIC_OR_DEPARTMENT_OTHER): Payer: Self-pay | Admitting: Emergency Medicine

## 2023-03-30 ENCOUNTER — Emergency Department (HOSPITAL_BASED_OUTPATIENT_CLINIC_OR_DEPARTMENT_OTHER)
Admission: EM | Admit: 2023-03-30 | Discharge: 2023-03-30 | Disposition: A | Discharge status: Home / Self Care | Payer: Self-pay | Attending: Emergency Medicine | Admitting: Emergency Medicine

## 2023-03-30 DIAGNOSIS — W540XXA Bitten by dog, initial encounter: Secondary | ICD-10-CM | POA: Insufficient documentation

## 2023-03-30 DIAGNOSIS — Z23 Encounter for immunization: Secondary | ICD-10-CM | POA: Insufficient documentation

## 2023-03-30 DIAGNOSIS — Z203 Contact with and (suspected) exposure to rabies: Secondary | ICD-10-CM | POA: Insufficient documentation

## 2023-03-30 DIAGNOSIS — Z2914 Encounter for prophylactic rabies immune globin: Secondary | ICD-10-CM | POA: Insufficient documentation

## 2023-03-30 DIAGNOSIS — S81852A Open bite, left lower leg, initial encounter: Secondary | ICD-10-CM | POA: Insufficient documentation

## 2023-03-30 LAB — PREGNANCY, URINE: Preg Test, Ur: NEGATIVE

## 2023-03-30 MED ORDER — AMOXICILLIN-POT CLAVULANATE 875-125 MG PO TABS
1.0000 | ORAL_TABLET | Freq: Once | ORAL | Status: AC
  Administered 2023-03-30: 1 via ORAL
  Filled 2023-03-30: qty 1

## 2023-03-30 MED ORDER — RABIES IMMUNE GLOBULIN 150 UNIT/ML IM INJ
20.0000 [IU]/kg | INJECTION | Freq: Once | INTRAMUSCULAR | Status: AC
  Administered 2023-03-30: 2325 [IU]
  Filled 2023-03-30: qty 16

## 2023-03-30 MED ORDER — AMOXICILLIN-POT CLAVULANATE 875-125 MG PO TABS: 1.0000 | ORAL_TABLET | Freq: Two times a day (BID) | ORAL | 0 refills | Status: DC

## 2023-03-30 MED ORDER — RABIES VACCINE, PCEC IM SUSR
1.0000 mL | Freq: Once | INTRAMUSCULAR | Status: AC
  Administered 2023-03-30: 1 mL via INTRAMUSCULAR
  Filled 2023-03-30: qty 1

## 2023-03-30 NOTE — ED Triage Notes (Signed)
 Bit by customers dog around 9:30am Puncture wound to right hand Calf x 2, and right breast  Un known vaccination status for the dog.  Tetanus UTD  Happened in [place], [place]- near [place]

## 2023-03-30 NOTE — Discharge Instructions (Addendum)
 You were seen in the emerged from today for evaluation after your dog bites.  Please make sure you are keeping these clean daily with Dial soap and water.  Your rabies vaccinations were initiated today.  You will need to return to [facility] urgent care to receive the rest of your vaccinations.  I have included information for them in the discharge report.  You can actually call to schedule appointments on these days as well.  Again, I am sending you home with an antibiotic called Augmentin.  It is important that you take this twice daily and complete the entirety of the course.  You can take Tylenol and/or ibuprofen as needed for pain.  I have included more information in the discharge paperwork for you to review.  If you have any concerns, new or worsening symptoms, please return to your nearest emergency department for reevaluation.  Return to Urgent Care to complete the rabies vaccination series on: 04/02/23, 04/06/23, 04/13/23  You will need to   Contact a doctor if: You have more redness, swelling, or pain around your wound. Your wound feels warm to the touch. You have a fever or chills. You have a general feeling of sickness (malaise). You feel like you may vomit. You vomit. You have pain that does not get better. Get help right away if: You have a red streak going away from your wound. You have any of these coming from your wound: Non-clear fluid. More blood. Pus or a bad smell. You have trouble moving your injured area. You lose feeling (have numbness) or feel tingling anywhere on your body.

## 2023-03-30 NOTE — ED Provider Notes (Signed)
 [place] EMERGENCY DEPARTMENT AT [facility] Provider Note   CSN: [id number] Arrival date & time: 03/30/23  1316     History Chief Complaint  Patient presents with   Animal Bite    Joy Powers is a 33 y.o. female with history of asthma presents to the emergency department today for evaluation after dog bite today.  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  Reports that it was a larger dog.  She reports that she is updated on her tetanus.  She is unsure of rabies vaccination and is unsure how she would get this information.  She would like to continue with rabies vaccination.  Denies any fevers or any immunocompromise state.  She denies any bony pain or any falls or bony tenderness.  Denies any numbness or tingling.  Allergic to Aleve but can have ibuprofen.   Animal Bite Associated symptoms: no fever and no numbness        Home Medications Prior to Admission medications   Medication Sig Start Date End Date Taking? Authorizing Provider  fexofenadine (ALLEGRA) 180 MG tablet Take 180 mg by mouth daily.    [provider]  Fluticasone-Umeclidin-Vilant (TRELEGY ELLIPTA) 200-62.5-25 MCG/INH AEPB Inhale 1 puff into the lungs daily. 06/23/20   Hetty Blend, FNP  Ibuprofen 200 MG CAPS Take 400 mg by mouth every 6 (six) hours as needed (cramping).    [provider]  levalbuterol Pauline Aus HFA) 45 MCG/ACT inhaler Inhale 2 puffs into the lungs every 4 (four) hours as needed for wheezing or shortness of breath. Patient not taking: Reported on 04/29/2021 06/23/20   Hetty Blend, FNP  levalbuterol Pauline Aus) 1.25 MG/3ML nebulizer solution Take 1.25 mg by nebulization every 4 (four) hours as needed for wheezing or shortness of breath. Patient not taking: Reported on 04/29/2021 04/19/20   Ellamae Sia, DO  montelukast (SINGULAIR) 10 MG tablet Take 1 tablet (10 mg total) by mouth at bedtime. Patient not taking: Reported on 04/29/2021  06/23/20   Hetty Blend, FNP      Allergies    Aleve [naproxen sodium]    Review of Systems   Review of Systems  Constitutional:  Negative for chills and fever.  Respiratory:  Negative for shortness of breath.   Cardiovascular:  Negative for chest pain.  Gastrointestinal:  Negative for abdominal pain, nausea and vomiting.  Skin:  Positive for wound.  Neurological:  Negative for numbness.    Physical Exam Updated Vital Signs BP 136/78 (BP Location: Right Arm)   Pulse 88   Temp 98.1 F (36.7 C)   Resp 18   Wt 114.4 kg   LMP 03/20/2023   SpO2 98%   BMI 44.69 kg/m   Physical Exam Vitals and nursing note reviewed.  Constitutional:      General: She is not in acute distress.    Appearance: She is not ill-appearing or toxic-appearing.  HENT:     Mouth/Throat:     Mouth: Mucous membranes are moist.  Eyes:     General: No scleral icterus. Pulmonary:     Effort: Pulmonary effort is normal. No respiratory distress.  Abdominal:     Palpations: Abdomen is soft.     Tenderness: There is no abdominal tenderness.  Musculoskeletal:     Right lower leg: No edema.     Left lower leg: No edema.     Comments: No bony tenderness in bilateral upper and  lower extremities.   Skin:    General: Skin is warm and dry.     Comments: Please see images.  Very small, shallow almost abrasions noted.  I do not appreciate any puncture deep wound.  These are present in the left lower leg, posterior left leg and left buttock, right wrist/hand, and right nipple.  There is a very superficial scratch noted to the anterior right thigh as well.  No bleeding or discharge.  No red streaking noted.  No foreign bodies palpated.  Pulses palpable for apartments are soft.  Sensation reportedly intact and symmetric per patient.  Neurological:     Mental Status: She is alert.                  ED Results / Procedures / Treatments   Labs (all labs ordered are listed, but only abnormal results are  displayed) Labs Reviewed  PREGNANCY, URINE    EKG None  Radiology No results found.  Procedures Procedures   Medications Ordered in ED Medications  amoxicillin-clavulanate (AUGMENTIN) 875-125 MG per tablet 1 tablet (1 tablet Oral Given 03/30/23 1538)  rabies vaccine (RABAVERT) injection 1 mL (1 mL Intramuscular Given 03/30/23 1535)  rabies immune globulin (HYPERRAB/KEDRAB) injection 2,325 Units (2,325 Units Infiltration Given 03/30/23 1539)    ED Course/ Medical Decision Making/ A&P Clinical Course as of 03/30/23 1542  Sat Mar 30, 2023  1502 Tetanus was updated April 2023 [RR]    Clinical Course User Index [RR] Achille Rich, PA-C                               Medical Decision Making Amount and/or Complexity of Data Reviewed Labs: ordered.  Risk Prescription drug management.   33 y.o. female presents to the ER for evaluation of dog bites. Differential diagnosis includes but is not limited to infection, laceration. Vital signs unremarkable. Physical exam as noted above.   I independently reviewed and interpreted the patient's labs. Pregnancy test negative.  Patient is unknown to the dog's vaccinations date.  She does not know how she would get this access.  She would like to be given the rabies vaccination.  Tetanus is updated in 2023.  The bites thankfully seem to be more of abrasions.  I do not appreciate any punctures.  Please see pictures included in the chart.  There is no red streaking noted.  Patient is on immunocompromise.  First dose of Augmentin given here, will send her home with Augmentin prescription as well as rabies vaccination schedule.  She denies any other fall or injury.  Do not think any other imaging needs to be done at this time.  Recommended wound cleaning at home to prevent infections.  Wounds were cleansed thoroughly by nursing staff here.  She stable for discharge home with outpatient follow-up.  We discussed the results of the labs/imaging. The plan  is wound cleaning, taking antibiotics, follow-up with rabies vaccinations. We discussed strict return precautions and red flag symptoms. The patient verbalized their understanding and agrees to the plan. The patient is stable and being discharged home in good condition.  Portions of this report may have been transcribed using voice recognition software. Every effort was made to ensure accuracy; however, inadvertent computerized transcription errors may be present.   Final Clinical Impression(s) / ED Diagnoses Final diagnoses:  Dog bite, initial encounter    Rx / DC Orders ED Discharge Orders  Ordered    amoxicillin-clavulanate (AUGMENTIN) 875-125 MG tablet  Every 12 hours        03/30/23 1548              Achille Rich, [place] 03/30/23 1603    Glyn Ade, MD 03/30/23 2235

## 2023-04-03 ENCOUNTER — Encounter (HOSPITAL_COMMUNITY): Payer: Self-pay | Admitting: Emergency Medicine

## 2023-04-03 ENCOUNTER — Ambulatory Visit (HOSPITAL_COMMUNITY)
Admission: EM | Admit: 2023-04-03 | Discharge: 2023-04-03 | Disposition: A | Discharge status: Home / Self Care | Payer: Self-pay | Attending: Emergency Medicine | Admitting: Emergency Medicine

## 2023-04-03 DIAGNOSIS — Z23 Encounter for immunization: Secondary | ICD-10-CM

## 2023-04-03 DIAGNOSIS — B379 Candidiasis, unspecified: Secondary | ICD-10-CM

## 2023-04-03 DIAGNOSIS — Z203 Contact with and (suspected) exposure to rabies: Secondary | ICD-10-CM

## 2023-04-03 MED ORDER — RABIES VACCINE, PCEC IM SUSR
INTRAMUSCULAR | Status: AC
  Filled 2023-04-03: qty 1

## 2023-04-03 MED ORDER — FLUCONAZOLE 150 MG PO TABS: ORAL_TABLET | ORAL | 0 refills | Status: DC

## 2023-04-03 MED ORDER — RABIES VACCINE, PCEC IM SUSR
1.0000 mL | Freq: Once | INTRAMUSCULAR | Status: AC
Start: 2023-04-03 — End: 2023-04-03
  Administered 2023-04-03: 1 mL via INTRAMUSCULAR

## 2023-04-03 NOTE — ED Triage Notes (Signed)
 Pt here to get 2nd rabies vaccination.  Reports taking antibiotics and when takes them will get yeast infection and didn't let them know at ED, so wanting a Rx for Diflucan.  Denies any complications from dog bites or vaccinations.

## 2023-04-03 NOTE — ED Provider Notes (Signed)
 [facility]    CSN: [id number] Arrival date & time: 04/03/23  1612      History   Chief Complaint Chief Complaint  Patient presents with   Rabies Injection   Medication Refill    HPI Joy Powers is a 33 y.o. female.   Patient initially presented here for second rabies vaccine.  Patient was seen in the ER for a dog bite at that time and was prescribed antibiotics.    Patient states that she forgot to mention that she needs Diflucan when she takes antibiotics to prevent yeast infection.  Patient states that she has had some vaginal irritation, itching, and increased discharge over the last 3 days.  Denies dysuria, hematuria, and vaginal bleeding.   Medication Refill   Past Medical History:  Diagnosis Date   Asthma    controlled on symbicort, never hospitalized, never intubated   Birth control    on tri-norinyl   Seasonal allergies    on allegra, nasonex    Patient Active Problem List   Diagnosis Date Noted   Other allergic rhinitis 04/19/2020   Allergic conjunctivitis of both eyes 04/19/2020   Asthma, not well controlled, unspecified asthma severity, uncomplicated 09/05/2010   Seasonal allergies 09/05/2010   Birth control 09/05/2010    History reviewed. No pertinent surgical history.  OB History   No obstetric history on file.      Home Medications    Prior to Admission medications   Medication Sig Start Date End Date Taking? Authorizing Provider  fluconazole (DIFLUCAN) 150 MG tablet Take one tablet today and one tablet in 3 days if symptoms persist. 04/03/23  Yes Wynonia Lawman A, NP  amoxicillin-clavulanate (AUGMENTIN) 875-125 MG tablet Take 1 tablet by mouth every 12 (twelve) hours. 03/30/23   Achille Rich, PA-C  fexofenadine (ALLEGRA) 180 MG tablet Take 180 mg by mouth daily.    [provider]  Fluticasone-Umeclidin-Vilant (TRELEGY ELLIPTA) 200-62.5-25 MCG/INH AEPB Inhale 1 puff into the lungs daily. 06/23/20   Hetty Blend,  FNP  Ibuprofen 200 MG CAPS Take 400 mg by mouth every 6 (six) hours as needed (cramping).    [provider]  levalbuterol Pauline Aus HFA) 45 MCG/ACT inhaler Inhale 2 puffs into the lungs every 4 (four) hours as needed for wheezing or shortness of breath. Patient not taking: Reported on 04/29/2021 06/23/20   Hetty Blend, FNP  levalbuterol Pauline Aus) 1.25 MG/3ML nebulizer solution Take 1.25 mg by nebulization every 4 (four) hours as needed for wheezing or shortness of breath. Patient not taking: Reported on 04/29/2021 04/19/20   Ellamae Sia, DO  montelukast (SINGULAIR) 10 MG tablet Take 1 tablet (10 mg total) by mouth at bedtime. Patient not taking: Reported on 04/29/2021 06/23/20   Hetty Blend, FNP    Family History Family History  Problem Relation Age of Onset   Hypertension Mother    Asthma Unknown    Allergies Unknown     Social History Social History   Tobacco Use   Smoking status: Never   Smokeless tobacco: Never  Substance Use Topics   Alcohol use: No   Drug use: No     Allergies   Aleve [naproxen sodium]   Review of Systems Review of Systems  Per HPI  Physical Exam Triage Vital Signs ED Triage Vitals [04/03/23 1729]  Encounter Vitals Group     BP (!) 141/87     Systolic BP Percentile      Diastolic BP Percentile  Pulse Rate 80     Resp 16     Temp 98.1 F (36.7 C)     Temp Source Oral     SpO2 97 %     Weight      Height      Head Circumference      Peak Flow      Pain Score 0     Pain Loc      Pain Education      Exclude from Growth Chart    No data found.  Updated Vital Signs BP (!) 141/87   Pulse 80   Temp 98.1 F (36.7 C) (Oral)   Resp 16   LMP 03/20/2023   SpO2 97%   Visual Acuity Right Eye Distance:   Left Eye Distance:   Bilateral Distance:    Right Eye Near:   Left Eye Near:    Bilateral Near:     Physical Exam Vitals and nursing note reviewed.  Constitutional:      General: She is awake. She is not in acute  distress.    Appearance: Normal appearance. She is well-developed and well-groomed. She is not ill-appearing.  Genitourinary:    Comments: Exam deferred Neurological:     Mental Status: She is alert.  Psychiatric:        Behavior: Behavior is cooperative.      UC Treatments / Results  Labs (all labs ordered are listed, but only abnormal results are displayed) Labs Reviewed - No data to display  EKG   Radiology No results found.  Procedures Procedures (including critical care time)  Medications Ordered in UC Medications  rabies vaccine (RABAVERT) injection 1 mL (1 mL Intramuscular Given 04/03/23 1822)    Initial Impression / Assessment and Plan / UC Course  I have reviewed the triage vital signs and the nursing notes.  Pertinent labs & imaging results that were available during my care of the patient were reviewed by me and considered in my medical decision making (see chart for details).     No significant findings upon exam.  GU exam deferred.  Ordered repeat rabies vaccine.  Prescribed Diflucan for yeast infection coverage.  Discussed return precautions. Final Clinical Impressions(s) / UC Diagnoses   Final diagnoses:  Yeast infection  Encounter for repeat administration of rabies vaccination     Discharge Instructions      Continue to return here for repeat rabies vaccines as scheduled.  I have sent Diflucan for yeast infection coverage.  Take 1 tablet today and another tablet in 3 days if symptoms persist.  Return here as needed.   ED Prescriptions     Medication Sig Dispense Auth. Provider   fluconazole (DIFLUCAN) 150 MG tablet Take one tablet today and one tablet in 3 days if symptoms persist. 2 tablet Wynonia Lawman A, NP      PDMP not reviewed this encounter.   Wynonia Lawman A, NP 04/03/23 [phone]

## 2023-04-03 NOTE — Discharge Instructions (Addendum)
 Continue to return here for repeat rabies vaccines as scheduled.  I have sent Diflucan for yeast infection coverage.  Take 1 tablet today and another tablet in 3 days if symptoms persist.  Return here as needed.

## 2023-04-06 ENCOUNTER — Ambulatory Visit (HOSPITAL_COMMUNITY)
Admission: EM | Admit: 2023-04-06 | Discharge: 2023-04-06 | Disposition: A | Discharge status: Home / Self Care | Payer: Self-pay | Attending: Family Medicine

## 2023-04-06 ENCOUNTER — Encounter (HOSPITAL_COMMUNITY): Payer: Self-pay

## 2023-04-06 DIAGNOSIS — Z203 Contact with and (suspected) exposure to rabies: Secondary | ICD-10-CM

## 2023-04-06 MED ORDER — RABIES VACCINE, PCEC IM SUSR
1.0000 mL | Freq: Once | INTRAMUSCULAR | Status: AC
  Administered 2023-04-06: 1 mL via INTRAMUSCULAR

## 2023-04-06 MED ORDER — RABIES VACCINE, PCEC IM SUSR
INTRAMUSCULAR | Status: AC
  Filled 2023-04-06: qty 1

## 2023-04-06 NOTE — ED Triage Notes (Signed)
 Patient states she is here for for her third rabies vaccine.

## 2023-04-13 ENCOUNTER — Ambulatory Visit (HOSPITAL_COMMUNITY): Payer: Self-pay

## 2023-04-24 ENCOUNTER — Other Ambulatory Visit: Payer: Self-pay

## 2023-04-24 DIAGNOSIS — R110 Nausea: Secondary | ICD-10-CM | POA: Insufficient documentation

## 2023-04-24 LAB — RESP PANEL BY RT-PCR (RSV, FLU A&B, COVID)  RVPGX2
Influenza A by PCR: NEGATIVE
Influenza B by PCR: NEGATIVE
Resp Syncytial Virus by PCR: NEGATIVE
SARS Coronavirus 2 by RT PCR: NEGATIVE

## 2023-04-24 LAB — PREGNANCY, URINE: Preg Test, Ur: NEGATIVE

## 2023-04-24 NOTE — ED Triage Notes (Signed)
 Nauseated since yesterday. Took pregnancy test-reports to be inconclusive. Coughing HA.

## 2023-04-25 ENCOUNTER — Emergency Department (HOSPITAL_BASED_OUTPATIENT_CLINIC_OR_DEPARTMENT_OTHER)
Admission: EM | Admit: 2023-04-25 | Discharge: 2023-04-25 | Disposition: A | Discharge status: Home / Self Care | Payer: Self-pay | Attending: Emergency Medicine | Admitting: Emergency Medicine

## 2023-04-25 DIAGNOSIS — R110 Nausea: Secondary | ICD-10-CM

## 2023-04-25 MED ORDER — ONDANSETRON 4 MG PO TBDP
4.0000 mg | ORAL_TABLET | Freq: Three times a day (TID) | ORAL | 0 refills | Status: AC | PRN
Start: 2023-04-25 — End: ?

## 2023-04-25 NOTE — Discharge Instructions (Signed)
 Follow up with your doctor if symptoms persist or worsen. Zofran every 8 hours if needed for further nausea.

## 2023-04-25 NOTE — ED Provider Notes (Signed)
 [place] EMERGENCY DEPARTMENT AT [facility] Provider Note   CSN: [id number] Arrival date & time: 04/24/23  1849     History  Chief Complaint  Patient presents with   Nausea   Possible Pregnancy    Joy Powers is a 33 y.o. female.  Patient to ED with c/o nausea onset tonight. She reports she has symptoms that she feels could be explained by her being pregnant and that is why she came to the ED. No pain, no diarrhea. She denies fever, cough, congestion. She has the Nexplanon in the left arm but it is expired. Her LMP was 3/27. Normal time, normal duration.   The history is provided by the patient. No language interpreter was used.  Possible Pregnancy       Home Medications Prior to Admission medications   Medication Sig Start Date End Date Taking? Authorizing Provider  ondansetron (ZOFRAN-ODT) 4 MG disintegrating tablet Take 1 tablet (4 mg total) by mouth every 8 (eight) hours as needed for nausea or vomiting. 04/25/23  Yes Mandy Second, PA-C  amoxicillin-clavulanate (AUGMENTIN) 875-125 MG tablet Take 1 tablet by mouth every 12 (twelve) hours. 03/30/23   Spence Dux, PA-C  fexofenadine (ALLEGRA) 180 MG tablet Take 180 mg by mouth daily.    [provider]  fluconazole (DIFLUCAN) 150 MG tablet Take one tablet today and one tablet in 3 days if symptoms persist. 04/03/23   Levora Reas A, NP  Fluticasone-Umeclidin-Vilant (TRELEGY ELLIPTA) 200-62.5-25 MCG/INH AEPB Inhale 1 puff into the lungs daily. 06/23/20   Ardie Kras, FNP  Ibuprofen 200 MG CAPS Take 400 mg by mouth every 6 (six) hours as needed (cramping).    [provider]  levalbuterol Bernhard Brine HFA) 45 MCG/ACT inhaler Inhale 2 puffs into the lungs every 4 (four) hours as needed for wheezing or shortness of breath. Patient not taking: Reported on 04/29/2021 06/23/20   Ardie Kras, FNP  levalbuterol (XOPENEX) 1.25 MG/3ML nebulizer solution Take 1.25 mg by nebulization every 4 (four) hours as  needed for wheezing or shortness of breath. Patient not taking: Reported on 04/29/2021 04/19/20   Trudy Fusi, DO  montelukast (SINGULAIR) 10 MG tablet Take 1 tablet (10 mg total) by mouth at bedtime. Patient not taking: Reported on 04/29/2021 06/23/20   Ardie Kras, FNP      Allergies    Aleve [naproxen sodium]    Review of Systems   Review of Systems  Physical Exam Updated Vital Signs BP 116/69 (BP Location: Right Arm)   Pulse 80   Temp (!) 97.5 F (36.4 C)   Resp 20   LMP 03/20/2023   SpO2 100%  Physical Exam Constitutional:      General: She is not in acute distress.    Appearance: She is well-developed. She is not ill-appearing.  Cardiovascular:     Rate and Rhythm: Normal rate.  Pulmonary:     Effort: Pulmonary effort is normal.  Abdominal:     Tenderness: There is no abdominal tenderness.  Musculoskeletal:        General: Normal range of motion.     Cervical back: Normal range of motion.  Skin:    General: Skin is warm and dry.  Neurological:     Mental Status: She is alert and oriented to person, place, and time.     ED Results / Procedures / Treatments   Labs (all labs ordered are listed, but only abnormal results are displayed) Labs Reviewed  RESP PANEL BY  RT-PCR (RSV, FLU A&B, COVID)  RVPGX2  PREGNANCY, URINE   Results for orders placed or performed during the hospital encounter of 04/25/23  Resp panel by RT-PCR (RSV, Flu A&B, Covid) Anterior Nasal Swab   Collection Time: 04/24/23  7:59 PM   Specimen: Anterior Nasal Swab  Result Value Ref Range   SARS Coronavirus 2 by RT PCR NEGATIVE NEGATIVE   Influenza A by PCR NEGATIVE NEGATIVE   Influenza B by PCR NEGATIVE NEGATIVE   Resp Syncytial Virus by PCR NEGATIVE NEGATIVE  Pregnancy, urine   Collection Time: 04/24/23  7:59 PM  Result Value Ref Range   Preg Test, Ur NEGATIVE NEGATIVE    EKG None  Radiology No results found.  Procedures Procedures    Medications Ordered in ED Medications - No  data to display  ED Course/ Medical Decision Making/ A&P Clinical Course as of 04/25/23 0030  Thu Apr 25, 2023  0028 Patient to ED for pregnancy test with complaint of nausea. Pregnancy test and viral panel negative. Discussed it may be early in viral process and symptoms may develop over time. Zofran provided to patient by prescription.  [SU]    Clinical Course User Index [SU] Mandy Second, PA-C                                 Medical Decision Making Amount and/or Complexity of Data Reviewed Labs: ordered.           Final Clinical Impression(s) / ED Diagnoses Final diagnoses:  Nausea    Rx / DC Orders ED Discharge Orders          Ordered    ondansetron (ZOFRAN-ODT) 4 MG disintegrating tablet  Every 8 hours PRN        04/25/23 0030              Mandy Second, PA-C 04/25/23 0030    Sallyanne Creamer, DO 04/29/23 1212

## 2023-05-29 ENCOUNTER — Ambulatory Visit
Admission: EM | Admit: 2023-05-29 | Discharge: 2023-05-29 | Disposition: A | Discharge status: Home / Self Care | Attending: Family Medicine | Admitting: Family Medicine

## 2023-05-29 DIAGNOSIS — S39012A Strain of muscle, fascia and tendon of lower back, initial encounter: Secondary | ICD-10-CM

## 2023-05-29 DIAGNOSIS — M545 Low back pain, unspecified: Secondary | ICD-10-CM

## 2023-05-29 MED ORDER — ACETAMINOPHEN 325 MG PO TABS: 650.0000 mg | ORAL_TABLET | Freq: Four times a day (QID) | ORAL | 0 refills | Status: AC | PRN
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
## 2023-06-09 ENCOUNTER — Emergency Department (HOSPITAL_COMMUNITY)
Admission: EM | Admit: 2023-06-09 | Discharge: 2023-06-10 | Disposition: A | Discharge status: Home / Self Care | Attending: Emergency Medicine | Admitting: Emergency Medicine

## 2023-06-09 ENCOUNTER — Encounter (HOSPITAL_COMMUNITY): Payer: Self-pay | Admitting: Emergency Medicine

## 2023-06-09 ENCOUNTER — Other Ambulatory Visit: Payer: Self-pay

## 2023-06-09 DIAGNOSIS — N3001 Acute cystitis with hematuria: Secondary | ICD-10-CM | POA: Insufficient documentation

## 2023-06-09 DIAGNOSIS — Z7951 Long term (current) use of inhaled steroids: Secondary | ICD-10-CM | POA: Diagnosis not present

## 2023-06-09 DIAGNOSIS — J45909 Unspecified asthma, uncomplicated: Secondary | ICD-10-CM | POA: Insufficient documentation

## 2023-06-09 DIAGNOSIS — R1110 Vomiting, unspecified: Secondary | ICD-10-CM | POA: Insufficient documentation

## 2023-06-09 DIAGNOSIS — M545 Low back pain, unspecified: Secondary | ICD-10-CM | POA: Insufficient documentation

## 2023-06-09 DIAGNOSIS — R109 Unspecified abdominal pain: Secondary | ICD-10-CM | POA: Diagnosis present

## 2023-06-09 LAB — COMPREHENSIVE METABOLIC PANEL WITH GFR
ALT: 9 U/L (ref 0–44)
AST: 20 U/L (ref 15–41)
Albumin: 3.6 g/dL (ref 3.5–5.0)
Alkaline Phosphatase: 65 U/L (ref 38–126)
Anion gap: 7 (ref 5–15)
BUN: 13 mg/dL (ref 6–20)
CO2: 22 mmol/L (ref 22–32)
Calcium: 8.8 mg/dL — ABNORMAL LOW (ref 8.9–10.3)
Chloride: 109 mmol/L (ref 98–111)
Creatinine, Ser: 0.69 mg/dL (ref 0.44–1.00)
GFR, Estimated: >60 mL/min (ref >60)
Glucose, Bld: 129 mg/dL — ABNORMAL HIGH (ref 70–99)
Potassium: 3.9 mmol/L (ref 3.5–5.1)
Sodium: 138 mmol/L (ref 135–145)
Total Bilirubin: 0.3 mg/dL (ref 0.0–1.2)
Total Protein: 8.4 g/dL — ABNORMAL HIGH (ref 6.5–8.1)

## 2023-06-09 LAB — CBC
HCT: 40.6 % (ref 36.0–46.0)
Hemoglobin: 12.9 g/dL (ref 12.0–15.0)
MCH: 27.6 pg (ref 26.0–34.0)
MCHC: 31.8 g/dL (ref 30.0–36.0)
MCV: 86.9 fL (ref 80.0–100.0)
Platelets: 222 10*3/uL (ref 150–400)
RBC: 4.67 MIL/uL (ref 3.87–5.11)
RDW: 15.1 % (ref 11.5–15.5)
WBC: 7.2 10*3/uL (ref 4.0–10.5)
nRBC: 0 % (ref 0.0–0.2)

## 2023-06-09 LAB — HCG, SERUM, QUALITATIVE: Preg, Serum: NEGATIVE

## 2023-06-09 LAB — LIPASE, BLOOD: Lipase: 32 U/L (ref 11–51)

## 2023-06-09 NOTE — ED Provider Notes (Signed)
 [place] EMERGENCY DEPARTMENT AT [facility] Provider Note   CSN: [id number] Arrival date & time: 06/09/23  2144     History {Add pertinent medical, surgical, social history, OB history to HPI:1} Chief Complaint  Patient presents with   Abdominal Pain   Back Pain   Emesis    Joy Powers is a 33 y.o. female.  33 year old female presents with complaint of abdominal pain onset this afternoon as well as pain in her back.  Patient states that she laid down with a heating pad and when she went to the bathroom she noted that there was spotting on the tissue paper.  She notes her last menstrual period was March 23, is sexually active, states the implant in her arm has expired.  Denies nausea, vomiting.  Last bowel movement was 2 days ago.  Denies urinary symptoms or vaginal discharge.  No other complaints or concerns. G2P0.       Home Medications Prior to Admission medications   Medication Sig Start Date End Date Taking? Authorizing Provider  acetaminophen  (TYLENOL ) 325 MG tablet Take 2 tablets (650 mg total) by mouth every 6 (six) hours as needed for moderate pain (pain score 4-6). 05/29/23   Adolph Hoop, PA-C  amoxicillin -clavulanate (AUGMENTIN ) 875-125 MG tablet Take 1 tablet by mouth every 12 (twelve) hours. 03/30/23   Spence Dux, PA-C  fexofenadine (ALLEGRA) 180 MG tablet Take 180 mg by mouth daily.    [provider]  fluconazole  (DIFLUCAN ) 150 MG tablet Take one tablet today and one tablet in 3 days if symptoms persist. 04/03/23   Levora Reas A, NP  Fluticasone-Umeclidin-Vilant (TRELEGY ELLIPTA ) 200-62.5-25 MCG/INH AEPB Inhale 1 puff into the lungs daily. 06/23/20   Ardie Kras, FNP  Ibuprofen  200 MG CAPS Take 400 mg by mouth every 6 (six) hours as needed (cramping).    [provider]  levalbuterol  (XOPENEX  HFA) 45 MCG/ACT inhaler Inhale 2 puffs into the lungs every 4 (four) hours as needed for wheezing or shortness of breath. Patient not  taking: Reported on 04/29/2021 06/23/20   Ardie Kras, FNP  levalbuterol  (XOPENEX ) 1.25 MG/3ML nebulizer solution Take 1.25 mg by nebulization every 4 (four) hours as needed for wheezing or shortness of breath. Patient not taking: Reported on 04/29/2021 04/19/20   Trudy Fusi, DO  montelukast  (SINGULAIR ) 10 MG tablet Take 1 tablet (10 mg total) by mouth at bedtime. Patient not taking: Reported on 04/29/2021 06/23/20   Ardie Kras, FNP  ondansetron  (ZOFRAN -ODT) 4 MG disintegrating tablet Take 1 tablet (4 mg total) by mouth every 8 (eight) hours as needed for nausea or vomiting. 04/25/23   Mandy Second, PA-C      Allergies    Aleve [naproxen sodium]    Review of Systems   Review of Systems Negative except as per HPI Physical Exam Updated Vital Signs BP (!) 155/108 (BP Location: Right Arm)   Pulse 91   Temp 98.5 F (36.9 C) (Oral)   Resp 18   Ht 5\' 3"  (1.6 m)   Wt 112.5 kg   LMP 05/14/2023   SpO2 100%   BMI 43.93 kg/m  Physical Exam Vitals and nursing note reviewed.  Constitutional:      General: She is not in acute distress.    Appearance: She is well-developed. She is not diaphoretic.  HENT:     Head: Normocephalic and atraumatic.  Cardiovascular:     Rate and Rhythm: Normal rate and regular rhythm.  Heart sounds: Normal heart sounds.  Pulmonary:     Effort: Pulmonary effort is normal.     Breath sounds: Normal breath sounds.  Abdominal:     Palpations: Abdomen is soft.     Tenderness: There is abdominal tenderness. There is no right CVA tenderness or left CVA tenderness.  Musculoskeletal:     Lumbar back: Tenderness present. No bony tenderness.       Back:  Skin:    General: Skin is warm and dry.     Findings: No erythema or rash.  Neurological:     Mental Status: She is alert and oriented to person, place, and time.  Psychiatric:        Behavior: Behavior normal.     ED Results / Procedures / Treatments   Labs (all labs ordered are listed, but only abnormal  results are displayed) Labs Reviewed  LIPASE, BLOOD  COMPREHENSIVE METABOLIC PANEL WITH GFR  CBC  URINALYSIS, ROUTINE W REFLEX MICROSCOPIC  HCG, SERUM, QUALITATIVE    EKG None  Radiology No results found.  Procedures Procedures  {Document cardiac monitor, telemetry assessment procedure when appropriate:1}  Medications Ordered in ED Medications - No data to display  ED Course/ Medical Decision Making/ A&P   {   Click here for ABCD2, HEART and other calculatorsREFRESH Note before signing :1}                              Medical Decision Making Amount and/or Complexity of Data Reviewed Labs: ordered.   This patient presents to the ED for concern of abdominal pain, vaginal bleeding, this involves an extensive number of treatment options, and is a complaint that carries with it a high risk of complications and morbidity.  The differential diagnosis includes but not limited to constipation, menstrual cycle, ectopic pregnancy vs miscarriage,    Co morbidities / Chronic conditions that complicate the patient evaluation  asthma   Additional history obtained:  Additional history obtained from EMR External records from outside source obtained and reviewed including ***   Lab Tests:  I Ordered, and personally interpreted labs.  The pertinent results include: CBC within normal is.  hCG negative.  Lipase normal.  CMP without significant findings.  Urinalysis***   Imaging Studies ordered:  I ordered imaging studies including ***  I independently visualized and interpreted imaging which showed *** I agree with the radiologist interpretation    Problem List / ED Course / Critical interventions / Medication management  *** I ordered medication including ***   Reevaluation of the patient after these medicines showed that the patient *** I have reviewed the patients home medicines and have made adjustments as needed   Consultations Obtained:  I requested consultation  with the ***,  and discussed lab and imaging findings as well as pertinent plan - they recommend: ***   Social Determinants of Health:  ***   Test / Admission - Considered:  ***   {Document critical care time when appropriate:1} {Document review of labs and clinical decision tools ie heart score, Chads2Vasc2 etc:1}  {Document your independent review of radiology images, and any outside records:1} {Document your discussion with family members, caretakers, and with consultants:1} {Document social determinants of health affecting pt's care:1} {Document your decision making why or why not admission, treatments were needed:1} Final Clinical Impression(s) / ED Diagnoses Final diagnoses:  None    Rx / DC Orders ED Discharge Orders  None

## 2023-06-09 NOTE — ED Triage Notes (Signed)
 Patient presents with abdominal and back pain.  She reports her abdomin as being more taut than usual. Pain is sharp in nature. She also reports nausea and vomiting since 04/23/2023. Denies diarrhea.

## 2023-06-10 LAB — URINALYSIS, ROUTINE W REFLEX MICROSCOPIC
Bilirubin Urine: NEGATIVE
Glucose, UA: NEGATIVE mg/dL
Ketones, ur: NEGATIVE mg/dL
Nitrite: POSITIVE — AB
Protein, ur: NEGATIVE mg/dL
Specific Gravity, Urine: 1.025 (ref 1.005–1.030)
pH: 5 (ref 5.0–8.0)

## 2023-06-10 MED ORDER — CEPHALEXIN 500 MG PO CAPS: 500.0000 mg | ORAL_CAPSULE | Freq: Two times a day (BID) | ORAL | 0 refills | Status: AC

## 2023-06-10 MED ORDER — CEPHALEXIN 500 MG PO CAPS
500.0000 mg | ORAL_CAPSULE | Freq: Once | ORAL | Status: AC
Start: 2023-06-10 — End: 2023-06-10
  Administered 2023-06-10: 500 mg via ORAL
  Filled 2023-06-10: qty 1

## 2023-06-10 NOTE — Discharge Instructions (Signed)
 Take as prescribed and complete the full course. Recheck with your primary care provider later this week. Return to ER for worsening or concerning symptoms.

## 2023-07-17 ENCOUNTER — Emergency Department (HOSPITAL_COMMUNITY)
Admission: EM | Admit: 2023-07-17 | Discharge: 2023-07-18 | Disposition: A | Discharge status: Home / Self Care | Attending: Emergency Medicine | Admitting: Emergency Medicine

## 2023-07-17 ENCOUNTER — Inpatient Hospital Stay (HOSPITAL_COMMUNITY)
Admission: AD | Admit: 2023-07-17 | Discharge: 2023-07-17 | Disposition: A | Discharge status: Home / Self Care | Source: Home / Self Care | Attending: Obstetrics & Gynecology | Admitting: Obstetrics & Gynecology

## 2023-07-17 ENCOUNTER — Encounter (HOSPITAL_COMMUNITY): Payer: Self-pay | Admitting: Obstetrics & Gynecology

## 2023-07-17 ENCOUNTER — Encounter (HOSPITAL_COMMUNITY): Payer: Self-pay | Admitting: Emergency Medicine

## 2023-07-17 DIAGNOSIS — R109 Unspecified abdominal pain: Secondary | ICD-10-CM | POA: Diagnosis not present

## 2023-07-17 DIAGNOSIS — R102 Pelvic and perineal pain: Secondary | ICD-10-CM | POA: Insufficient documentation

## 2023-07-17 DIAGNOSIS — Z3202 Encounter for pregnancy test, result negative: Secondary | ICD-10-CM

## 2023-07-17 LAB — CBC WITH DIFFERENTIAL/PLATELET
Abs Immature Granulocytes: 0.02 10*3/uL (ref 0.00–0.07)
Basophils Absolute: 0 10*3/uL (ref 0.0–0.1)
Basophils Relative: 0 %
Eosinophils Absolute: 0.1 10*3/uL (ref 0.0–0.5)
Eosinophils Relative: 1 %
HCT: 41.1 % (ref 36.0–46.0)
Hemoglobin: 12.7 g/dL (ref 12.0–15.0)
Immature Granulocytes: 0 %
Lymphocytes Relative: 41 %
Lymphs Abs: 3.1 10*3/uL (ref 0.7–4.0)
MCH: 27.2 pg (ref 26.0–34.0)
MCHC: 30.9 g/dL (ref 30.0–36.0)
MCV: 88 fL (ref 80.0–100.0)
Monocytes Absolute: 0.9 10*3/uL (ref 0.1–1.0)
Monocytes Relative: 12 %
Neutro Abs: 3.4 10*3/uL (ref 1.7–7.7)
Neutrophils Relative %: 46 %
Platelets: 268 10*3/uL (ref 150–400)
RBC: 4.67 MIL/uL (ref 3.87–5.11)
RDW: 14.8 % (ref 11.5–15.5)
WBC: 7.6 10*3/uL (ref 4.0–10.5)
nRBC: 0 % (ref 0.0–0.2)

## 2023-07-17 LAB — HCG, SERUM, QUALITATIVE: Preg, Serum: NEGATIVE

## 2023-07-17 LAB — URINALYSIS, ROUTINE W REFLEX MICROSCOPIC
Bilirubin Urine: NEGATIVE
Glucose, UA: NEGATIVE mg/dL
Hgb urine dipstick: NEGATIVE
Ketones, ur: NEGATIVE mg/dL
Nitrite: POSITIVE — AB
Protein, ur: 30 mg/dL — AB
Specific Gravity, Urine: 1.026 (ref 1.005–1.030)
WBC, UA: >50 WBC/hpf (ref 0–5)
pH: 5 (ref 5.0–8.0)

## 2023-07-17 LAB — COMPREHENSIVE METABOLIC PANEL WITH GFR
ALT: 10 U/L (ref 0–44)
AST: 15 U/L (ref 15–41)
Albumin: 3.4 g/dL — ABNORMAL LOW (ref 3.5–5.0)
Alkaline Phosphatase: 63 U/L (ref 38–126)
Anion gap: 7 (ref 5–15)
BUN: 9 mg/dL (ref 6–20)
CO2: 23 mmol/L (ref 22–32)
Calcium: 9.2 mg/dL (ref 8.9–10.3)
Chloride: 108 mmol/L (ref 98–111)
Creatinine, Ser: 0.81 mg/dL (ref 0.44–1.00)
GFR, Estimated: >60 mL/min (ref >60)
Glucose, Bld: 95 mg/dL (ref 70–99)
Potassium: 3.6 mmol/L (ref 3.5–5.1)
Sodium: 138 mmol/L (ref 135–145)
Total Bilirubin: 0.5 mg/dL (ref 0.0–1.2)
Total Protein: 8 g/dL (ref 6.5–8.1)

## 2023-07-17 LAB — POCT PREGNANCY, URINE: Preg Test, Ur: NEGATIVE

## 2023-07-17 LAB — LIPASE, BLOOD: Lipase: 29 U/L (ref 11–51)

## 2023-07-17 NOTE — Discharge Instructions (Signed)
 You can take Tylenol  and/or ibuprofen  for pain if needed. Heating pads and yoga can also be helpful!  ADULT OTC PAIN MEDICATIONS:  Acetaminophen  (Tylenol ) - Immediate-release: 325 mg to 1000 mg (1 g) orally every 4 to 6 hours - Minimum Dosing Interval: every 4 hours - Maximum Single Dose: 1000 mg - Maximum Dose: 4000 mg per 24 hours  -DO NOT TAKE IF DRINKING ALCOHOL  Ibuprofen  (Advil , Midol , Motrin ) -200 to 400 mg orally every 4 to 6 hours as needed -Maximum dose: 1200 mg/day (over-the-counter)  -STAY WELL HYDRATED TO REDUCE RISK OF KIDNEY DAMAGE -Increased risk of GI bleeds and kidney damage if taking too much or too often   Example: 6am: Tylenol  500 mg 8am: ibuprofen  400 mg (take with food) 10am: Tylenol  500 mg 12pm: ibuprofen  400 mg  2pm: Tylenol  500 mg 4pm: ibuprofen  400 mg 6pm: Tylenol  500 mg 8pm: ibuprofen  400 mg 10pm: Tylenol  500 mg  OR Tylenol  1000 mg + ibuprofen  400 mg to get you through the night with a dose of ibuprofen  sometime in the middle of the night

## 2023-07-17 NOTE — MAU Provider Note (Addendum)
 None     S [name] is a 33 y.o. No obstetric history on file. patient who presents to MAU today with complaint of intermittent cramping. Her last period started 6/23, she saw tissue and vaginal bleeding when wiping. The bleeding stopped on 6/27, but she continues to have intermittent cramping. She believes she is pregnant but her home tests have been negative. She has experienced irregular menstrual cycles since March. She knows that her IUD is expired but has not been able to have it removed yet as she just recently got insurance again.  O BP 122/68 (BP Location: Right Arm)   Pulse 94   Temp 98.4 F (36.9 C) (Oral)   Resp 15   Ht 5' 3.5 (1.613 m)   Wt 109.4 kg   SpO2 100%   BMI 42.06 kg/m  Physical Exam Vitals reviewed.  Constitutional:      General: She is not in acute distress.    Appearance: She is well-developed. She is not diaphoretic.  Eyes:     General: No scleral icterus. Pulmonary:     Effort: Pulmonary effort is normal. No respiratory distress.  Skin:    General: Skin is warm and dry.  Neurological:     Mental Status: She is alert.     Coordination: Coordination normal.     A Medical screening exam complete 1. Abdominal cramping (Primary) - Discharge patient  2. Negative pregnancy test - Discharge patient    P Discharge from MAU in stable condition Patient given the option of transfer to [facility] for further evaluation or seek care in outpatient facility of choice. Patient opted for discharge and to go to [facility] or [facility]. Referral placed to establish care with OBGYN. Warning signs for worsening condition that would warrant emergency follow-up discussed. Patient may return to MAU as needed.  Wallace Joesph LABOR, [place] 07/17/2023 8:46 PM

## 2023-07-17 NOTE — MAU Note (Signed)
..  Joy Powers is a 33 y.o. at Unknown here in MAU reporting: patient reports intermittent cramping since June 23rd, reports when she wipes she saw tissue and vaginal bleeding then. The bleeding stopped on Friday but the pain has continued.  She believes she is pregnant but her tests have been negative.  LMP: 07/08/2023  Pain score: 10/10 when the pain comes Vitals:   07/17/23 2009  BP: 122/68  Pulse: 94  Resp: 15  Temp: 98.4 F (36.9 C)  SpO2: 100%     FHT:n/a Lab orders placed from triage:  POCT pregnancy test

## 2023-07-17 NOTE — ED Notes (Signed)
 PT states she got BC in arm in 2020.

## 2023-07-18 LAB — GC/CHLAMYDIA PROBE AMP (~~LOC~~) NOT AT ARMC
Chlamydia: NEGATIVE
Comment: NEGATIVE
Comment: NORMAL
Neisseria Gonorrhea: NEGATIVE

## 2023-07-18 LAB — WET PREP, GENITAL
Clue Cells Wet Prep HPF POC: NONE SEEN
Sperm: NONE SEEN
Trich, Wet Prep: NONE SEEN
WBC, Wet Prep HPF POC: <10 (ref <10)
Yeast Wet Prep HPF POC: NONE SEEN

## 2023-07-18 MED ORDER — ACETAMINOPHEN 325 MG PO TABS
650.0000 mg | ORAL_TABLET | Freq: Once | ORAL | Status: AC
  Administered 2023-07-18: 650 mg via ORAL
  Filled 2023-07-18: qty 2

## 2023-07-18 NOTE — ED Provider Notes (Signed)
 [place] [facility] AT [facility] Provider Note   CSN: [id number] Arrival date & time: 07/17/23  2114     Patient presents with: Abdominal Pain (Patient sent from [facility] for abd pain eval since preg test (-), patient reports passing tissue during cycle on the 23rd and contractions since then with sharp pain. Ambulatory, alert, and calm on arrival. )   Joy Powers is a 33 y.o. female.   The history is provided by the patient.  Patient presents for pelvic pain.  Patient reports since June 23 she has been having intermittent lower pelvic pain.  She had some vaginal spotting that has since resolved.  No discharge.  No fevers or vomiting.  No change in bowel movements. No dysuria or urinary frequency.  She is not concerned about any exposure to STDs. Diagnosed with  UTI back in May and has taken antibiotics   Patient was [facility] was sent here because she is not pregnant  Denies previous history of surgery Prior to Admission medications   Medication Sig Start Date End Date Taking? Authorizing Provider  acetaminophen  (TYLENOL ) 325 MG tablet Take 2 tablets (650 mg total) by mouth every 6 (six) hours as needed for moderate pain (pain score 4-6). 05/29/23   Christopher Savannah, PA-C  fexofenadine (ALLEGRA) 180 MG tablet Take 180 mg by mouth daily.    [provider]  Fluticasone-Umeclidin-Vilant (TRELEGY ELLIPTA ) 200-62.5-25 MCG/INH AEPB Inhale 1 puff into the lungs daily. 06/23/20   Cari Arlean HERO, FNP  Ibuprofen  200 MG CAPS Take 400 mg by mouth every 6 (six) hours as needed (cramping).    [provider]  montelukast  (SINGULAIR ) 10 MG tablet Take 1 tablet (10 mg total) by mouth at bedtime. Patient taking differently: Take 10 mg by mouth every morning. 06/23/20   Cari Arlean HERO, FNP  ondansetron  (ZOFRAN -ODT) 4 MG disintegrating tablet Take 1 tablet (4 mg total) by mouth every 8 (eight) hours as needed for nausea or  vomiting. 04/25/23   Odell Balls, PA-C    Allergies: Naproxen and Aleve [naproxen sodium]    Review of Systems  Constitutional:  Negative for fever.  Gastrointestinal:  Negative for vomiting.  Genitourinary:  Positive for pelvic pain. Negative for dysuria.    Updated Vital Signs BP 108/65   Pulse (!) 51   Temp 98.3 F (36.8 C) (Oral)   Resp 15   SpO2 100%   Physical Exam CONSTITUTIONAL: Well developed/well nourished HEAD: Normocephalic/atraumatic ENMT: Mucous membranes moist NECK: supple no meningeal signs CV: S1/S2 noted, no murmurs/rubs/gallops noted LUNGS: Lungs are clear to auscultation bilaterally, no apparent distress ABDOMEN: soft, nontender, no rebound or guarding, bowel sounds noted throughout abdomen GU:no cva tenderness Pelvic exam performed with paramedic present. White vaginal discharge, no bleeding.  No CMT. Exam is limited by her obesity No external lesions to the genitalia NEURO: Pt is awake/alert/appropriate, moves all extremitiesx4.  No facial droop.   SKIN: warm, color normal PSYCH: no abnormalities of mood noted, alert and oriented to situation  (all labs ordered are listed, but only abnormal results are displayed) Labs Reviewed  COMPREHENSIVE METABOLIC PANEL WITH GFR - Abnormal; Notable for the following components:      Result Value   Albumin 3.4 (*)    All other components within normal limits  URINALYSIS, ROUTINE W REFLEX MICROSCOPIC - Abnormal; Notable for the following components:   Color, Urine AMBER (*)    APPearance CLOUDY (*)  Protein, ur 30 (*)    Nitrite POSITIVE (*)    Leukocytes,Ua LARGE (*)    Bacteria, UA MANY (*)    Non Squamous Epithelial 0-5 (*)    All other components within normal limits  WET PREP, GENITAL  URINE CULTURE  LIPASE, BLOOD  CBC WITH DIFFERENTIAL/PLATELET  HCG, SERUM, QUALITATIVE  GC/CHLAMYDIA PROBE AMP ([place]) NOT AT [facility]    EKG: None  Radiology: No results found.   Procedures    Medications Ordered in the ED  acetaminophen  (TYLENOL ) tablet 650 mg (650 mg Oral Given 07/18/23 0322)    Clinical Course as of 07/18/23 0436  Thu Jul 18, 2023  0345 Patient presents for pelvic pain she has had intermittently since June 23.  Overall her exam is unremarkable at this time. Urine does reveal potential UTI versus contaminated sample, she already completed a course of antibiotics and denies any urinary symptoms.  Will plan to send urine culture and defer antibiotics for now.   No indication for emergent ultrasound imaging.  Will refer to the [facility] Center for [facility] Given patient's overall comfort level and her exam, low suspicion for torsion or tubo-ovarian abscess. History is not consistent with acute abdominal emergency at this time [DW]    Clinical Course User Index [DW] Midge Golas, MD                                 Medical Decision Making Amount and/or Complexity of Data Reviewed Labs: ordered.  Risk OTC drugs.   This patient presents to the ED for concern of pelvic/abdominal pain, this involves an extensive number of treatment options, and is a complaint that carries with it a high risk of complications and morbidity.  The differential diagnosis includes but is not limited to cholecystitis, cholelithiasis, pancreatitis, gastritis, peptic ulcer disease, appendicitis, bowel obstruction, bowel perforation, diverticulitis, ectopic pregnancy, tubo-ovarian abscess, ovarian torsion, PID    Comorbidities that complicate the patient evaluation: Patient's presentation is complicated by their history of asthma  Social Determinants of Health: Patient's lack of access to gynecology  increases the complexity of managing their presentation  Additional history obtained: Records reviewed MAU notes  Lab Tests: I Ordered, and personally interpreted labs.  The pertinent results include: UTI versus contaminated sample   Reevaluation: After the interventions noted above,  I reevaluated the patient and found that they have :stayed the same  Complexity of problems addressed: Patient's presentation is most consistent with  acute presentation with potential threat to life or bodily function  Disposition: After consideration of the diagnostic results and the patient's response to treatment,  I feel that the patent would benefit from discharge  .   4:36 AM Patient stable watching TV.  On repeat assessment prior to discharge she has no focal abdominal tenderness, no vomiting  Patient safe for discharge, advised to call the med Center for women today Patient is appropriate for d/c home.  I doubt acute abdominal emergency at this time.  We discussed strict ER return precautions including abdominal pain that migrates to RLQ, fever >100.29F with repetitive vomiting over next 8-12 hours    Final diagnoses:  Pelvic pain    ED Discharge Orders     None          Midge Golas, MD 07/18/23 [phone]

## 2023-07-18 NOTE — ED Notes (Signed)
 Pt states pain is more pelvic than abdominal

## 2023-07-18 NOTE — Discharge Instructions (Signed)

## 2023-07-20 LAB — URINE CULTURE: Culture: >=100000 — AB

## 2023-07-21 ENCOUNTER — Telehealth (HOSPITAL_BASED_OUTPATIENT_CLINIC_OR_DEPARTMENT_OTHER): Payer: Self-pay | Admitting: *Deleted

## 2023-07-21 NOTE — Progress Notes (Signed)
 ED Antimicrobial Stewardship Positive Culture Follow Up   Joy Powers is an 33 y.o. female who presented to [facility] on @ADMITDT @ with a chief complaint of  Chief Complaint  Patient presents with   Abdominal Pain    Patient sent from [facility]U for abd pain eval since preg test (-), patient reports passing tissue during cycle on the 23rd and contractions since then with sharp pain. Ambulatory, alert, and calm on arrival.     Recent Results (from the past 720 hours)  Wet prep, genital     Status: None   Collection Time: 07/18/23  3:20 AM   Specimen: PATH Cytology Cervicovaginal Ancillary Only  Result Value Ref Range Status   Yeast Wet Prep HPF POC NONE SEEN NONE SEEN Final   Trich, Wet Prep NONE SEEN NONE SEEN Final   Clue Cells Wet Prep HPF POC NONE SEEN NONE SEEN Final   WBC, Wet Prep HPF POC <10 <10 Final   Sperm NONE SEEN  Final    Comment: Performed at [facility], [address], [place], [place] [address]  Urine Culture     Status: Abnormal   Collection Time: 07/18/23  3:45 AM   Specimen: Urine, Clean Catch  Result Value Ref Range Status   Specimen Description URINE, CLEAN CATCH  Final   Special Requests   Final    NONE Performed at [facility], [address], [place], [place] [address]    Culture >=100,000 COLONIES/mL ESCHERICHIA COLI (A)  Final   Report Status 07/20/2023 FINAL  Final   Organism ID, Bacteria ESCHERICHIA COLI (A)  Final      Susceptibility   Escherichia coli - MIC*    AMPICILLIN >=32 RESISTANT Resistant     CEFAZOLIN <=4 SENSITIVE Sensitive     CEFEPIME <=0.12 SENSITIVE Sensitive     CEFTRIAXONE <=0.25 SENSITIVE Sensitive     CIPROFLOXACIN <=0.25 SENSITIVE Sensitive     GENTAMICIN >=16 RESISTANT Resistant     IMIPENEM <=0.25 SENSITIVE Sensitive     NITROFURANTOIN <=16 SENSITIVE Sensitive     TRIMETH/SULFA >=320 RESISTANT Resistant     AMPICILLIN/SULBACTAM 16 INTERMEDIATE Intermediate     PIP/TAZO <=4 SENSITIVE Sensitive ug/mL    *  >=100,000 COLONIES/mL ESCHERICHIA COLI    [x]  Patient discharged originally without antimicrobial agent and treatment is now indicated  START: Keflex  500mg  BID x 5 days (Qty 10; Refills 0)  ED Provider: Melvenia Dorn Buttner, PharmD, BCPS 07/21/2023 11:43 AM ED Clinical Pharmacist -  [phone]

## 2023-07-21 NOTE — Telephone Encounter (Signed)
 Post ED Visit - Positive Culture Follow-up: Unsuccessful Patient Follow-up  Culture assessed and recommendations reviewed by:  [x]  Dorn Buttner, Pharm.D. []  Venetia Gully, Pharm.D., BCPS AQ-ID []  Garrel Crews, Pharm.D., BCPS []  Almarie Lunger, Pharm.D., BCPS []  [place], [address].D., BCPS, AAHIVP []  Rosaline Bihari, Pharm.D., BCPS, AAHIVP []  Massie Rigg, PharmD []  Jodie Rower, PharmD, BCPS  Positive urine culture  [x]  Patient discharged without antimicrobial prescription and treatment is now indicated []  Organism is resistant to prescribed ED discharge antimicrobial []  Patient with positive blood cultures  Plan: Start Keflex  500mg  BID x 5 days ( QTY 10; 0 refills) per Bernardino Fireman, MD  Unable to contact patient , letter will be sent to address on file  Albino Alan Novak 07/21/2023, 2:31 PM

## 2023-07-29 ENCOUNTER — Encounter: Admitting: Family Medicine

## 2023-08-02 NOTE — Progress Notes (Signed)
 Annual GYN Note  CC:  Annual visit   HPI: The patient is a 33 y.o. G2P0020, Patient's last menstrual period was 07/08/2023 (approximate)., here for annual visit.    Gyn complaints: Patient has had Nexplanon in place for 5 years.  She reports that recently she has been being concerned that she is pregnant.  She reports that she had passage of something like tissue at the end of June.  The entire time she has had her Nexplanon her periods have been regular and every month and they continue to be regular every month.  She does report that over the past few months the periods have been shorter in length that they have still been monthly.  The patient is sexually active.       ROS negative except as above.  ObHx: OB History  Gravida Para Term Preterm AB Living  2 0  0 2   SAB IAB Ectopic Molar Multiple Live Births           # Outcome Date GA Lbr Len/2nd Weight Sex Type Anes PTL Lv  2 AB           1 AB              PMH:  Medical History[1]  PSH:  Surgical History[2]  FamH:  Family History[3]   SH:  Alcohol Screening: Not on file    Meds:    Medications Ordered Prior to Encounter[4]  All:  Allergies[5]   Physical Exam: BP 107/72   Pulse 60   Ht 1.6 m (5' 3)   Wt 109 kg (241 lb)   LMP 07/08/2023 (Approximate)   BMI 42.69 kg/m  General Appearance:  Well developed, well nourished, no acute distress Psychiatric:  Normal mood and affect, appropriate; alert and oriented to person, place, and time Skin: No rashes or lesion; no induration or nodules noted Lymphatics: No axillary, or inguinal adenopathy noted  Cardiovascular:  Extremities grossly normal, nontender with no edema; Pulmonary: Good air movement with normal work of breathing.  Abdomen: Soft, nontender, nondistended. No masses or hernias appreciated. No rebound or guarding.   Breast:    Inspection: no asymmetry, skin changes, or nipple discharge/blood Palpation: no masses palpated, no tenderness  Pelvic:         External Genitalia: Normal female genitalia w/o masses, lesions, rashes    Urethral Meatus: Normal caliber and position    Urethra: Midline, no masses    Bladder: Well-suspended, NT    Vagina: mucosa pink and moist without lesions or ulcers     Cervix: No lesions, normal size and consistency; no cervical motion tenderness     Uterus: Normal size and contour; smooth, mobile, NT Adnexa/Parametria: No masses; no parametrial nodularity; no tenderness Perineum/Anus: No lesions, no hemorrhoids        Assessment/Plan:  33 y.o. G2P0020 here for annual visit. Problem List Items Addressed This Visit   None Visit Diagnoses       Encounter for gynecological examination (general) (routine) without abnormal findings    -  Primary     Need for HPV vaccine       Relevant Orders   POC HCG Qualitative, Urine     Cervical cancer screening       Relevant Orders   Pap Smear, Liquid Based     Screening examination for STD (sexually transmitted disease)       Relevant Orders   Chlamydia / Gonococcus (GC), NAAT   Hepatitis B Surface Antigen (HBSAG)  Screen, Qualitative With Confirmation   Hepatitis C Virus (HCV) Antibody Screen With Confirmation   HIV Screen with Reflex to Confirmation   Rapid Plasma Reagin (RPR), Qualitative Test with Reflex to Titer and Confirmation   POC Wet Prep   POC KOH Test, Vaginal        1.  Health maintenance: Counseling and/or handouts provided  Cervical cancer screening: Reviewed ASCCP guidelines for screening.  ordered today   Patient  has not received Gardisil vaccine - start today  Mammogram: Discussed ACOG recommendation for women at average risk, including risks and benefits associated with screening.  We discussed screening starting at age 32 years, but no later than age 49 years.  We discussed screening mammography every 1-2 years until at least age 56. Reviewed breast self awareness Not indicated  Patient does not have a family history of breast  cancer.  Colorectal screening: Starting at age 39, colonoscopy every 10 years or as instructed based on findings  Not indicated  DEXA: Reviewed screening recommendations for normal women (age 8) or those at increased risk such as h/o compression fx, fragility fxs, glucocorticoid tx > 3 mo, malabsorption syndrome, primary PTH, propensity to fall, osteopenia, hypogonadism, menopause prior to age 3, RA, chronic anticonvulsant therapy, smoking, EtOH abuse, excessive caffeine intake, chronic heparin use, wt < 57 kg.   Not indicated   2.  Counseling: Counseling and/or handouts provided regarding:    weight control Pt encouraged to walk 3x/week at a vigorous pace to help control weight, maintain mobility, and improve cardiovascular control.  Also encouraged portion/calorie control and avoidance of empty calories found in juice, soft drinks, and fast food preparations.   3.  Contraception and STI screening Refilled none STI screening:  pan screening ordered.    4. Nexplanon in place, expired Patient with expired Nexplanon in place  She is concerned that she may be pregnant although she is taken negative pregnancy test at home.  UPT today negative Reviewed recommendation for backup contraception as likely not effective given BMI Will have her return for Nexplanon removal She has had shorter periods over the past few months.  Reviewed this could be due to Nexplanon and having decreased the amount of hormones.  Recommend remove Nexplanon and evaluate menses at that time     Orders Placed This Encounter  Procedures  . Chlamydia / Gonococcus (GC), NAAT    Standing Status:   Future    Expected Date:   08/02/2023    Expiration Date:   08/01/2024    Release to patient::   Immediate  . Hepatitis B Surface Antigen (HBSAG) Screen, Qualitative With Confirmation    Standing Status:   Future    Expected Date:   08/02/2023    Expiration Date:   08/01/2024    Release to patient::   Immediate  .  Hepatitis C Virus (HCV) Antibody Screen With Confirmation    Standing Status:   Future    Expected Date:   08/02/2023    Expiration Date:   08/01/2024    Release to patient::   Immediate  . HIV Screen with Reflex to Confirmation    Standing Status:   Future    Expected Date:   08/02/2023    Expiration Date:   08/01/2024    Release to patient::   Immediate  . Rapid Plasma Reagin (RPR), Qualitative Test with Reflex to Titer and Confirmation    Standing Status:   Future    Expected Date:   08/02/2023  Expiration Date:   08/01/2024    Release to patient::   Immediate  . POC HCG Qualitative, Urine  . POC Wet Prep  . POC KOH Test, Vaginal     Requested Prescriptions    No prescriptions requested or ordered in this encounter            No follow-ups on file.            [1] History reviewed. No pertinent past medical history. [2] History reviewed. No pertinent surgical history. [3] Family History Problem Relation Name Age of Onset  . Heart failure Father    . Kidney failure Father    . Diabetes Mother    [4] Current Outpatient Medications on File Prior to Visit  Medication Sig Dispense Refill  . fexofenadine (ALLEGRA) 180 mg tablet Take 180 mg by mouth daily.    . fluticasone-umeclidinium-vilanterol (TRELEGY ELLIPTA ) 200-62.5-25 mcg inhaler Inhale 1 puff daily.    SABRA loratadine (CLARITIN) 10 mg tablet Take 10 mg by mouth daily. AS DIRECTED    . montelukast  (SINGULAIR ) 10 mg tablet Take 10 mg by mouth daily.     No current facility-administered medications on file prior to visit.  [5] Allergies Allergen Reactions  . Naproxen Hives and Shortness Of Breath

## 2023-08-09 NOTE — Progress Notes (Signed)
 CC: Nexplanon removal  Subjective:   HPI: Joy Powers is a 33 y.o. y/o G2P0020 who presents today for Nexplanon removal.  Patient's last menstrual period was 07/08/2023 (approximate).SABRA    HPI 08/02/2023 Gyn complaints: Patient has had Nexplanon in place for 5 years.  She reports that recently she has been being concerned that she is pregnant.  She reports that she had passage of something like tissue at the end of June.  The entire time she has had her Nexplanon her periods have been regular and every month and they continue to be regular every month.  She does report that over the past few months the periods have been shorter in length that they have still been monthly.    Past medical, surgical, social and family histories reviewed and updated.   ______________________________________________________________________  Surgical History[1]  Allergies[2]  Medical History[3]  OB History  Gravida Para Term Preterm AB Living  2 0  0 2   SAB IAB Ectopic Molar Multiple Live Births           # Outcome Date GA Lbr Len/2nd Weight Sex Type Anes PTL Lv  2 AB           1 AB               Family History[4]  Social History   Socioeconomic History  . Marital status: Single    Spouse name: Not on file  . Number of children: Not on file  . Years of education: Not on file  . Highest education level: Not on file  Occupational History  . Not on file  Tobacco Use  . Smoking status: Never  . Smokeless tobacco: Never  Substance and Sexual Activity  . Alcohol use: Not Currently  . Drug use: Not Currently  . Sexual activity: Yes  Other Topics Concern  . Not on file  Social History Narrative  . Not on file   Social Drivers of Health   Food Insecurity: Not on file  Transportation Needs: Not on file  Safety: Not on file  Living Situation: Not on file    Current Medications[5]   Review Of Systems negative except as above.  Objective:   PHYSICAL EXAM: LMP 07/08/2023  (Approximate)  Constitutional: Well-developed, well-nourished female in no acute distress Neurological: Alert and oriented to person, place, and time Psychiatric: Mood and affect appropriate Skin: Warm and dry, no rashes or lesions Respiratory: Normal work of breathing. Cardiovascular: Warm and well perfused Gastrointestinal: Soft, nontender, nondistended. Genitourinary:       Deferred   Insertion/Removal/Reinsertion of Contraceptive Device  Date/Time: 08/09/2023 1:01 PM  Performed by: Lavanda Rumalda Glaze, MD Authorized by: Lavanda Rumalda Glaze, MD   Consent:    Consent obtained:  Written   Consent given by:  Patient   Patient questions answered: yes     Patient agrees, verbalizes understanding, and wants to proceed: yes     Indication: presence of non-biodegradable drug delivery implant   Pre-procedure:    Prepped with: povidone-iodine and chlorhexidine gluconate   Local anesthetic:  Lidocaine  with epinephrine Procedure: removal Removed intact device with no complications: Yes  Procedure Type: subdermal Post-procedure:  Patient tolerated procedure well: Yes Patient will follow up after next period: Yes Comments:Device measured 4cm at completion of procedure.     Assessment/Plan:  Joy Powers is a 33 y.o. female G2P0020 who presents for Nexplanon Removal  Nexplanon removal - Per procedure ntoe  Irregular menses - Suspect due to Nexplanon.  Nexplanon removed today.  Discussed with patient if menses are still irregular over the next 2 to 3 months she should notify me so we can further evaluate - Periods still monthly but have been shorter recently     This document serves as a record of services personally performed by Lavanda Rumalda Glaze, MD.  It was created on their behalf by Richerd Panda, CMA, a trained medical scribe, and Certified Medical Assistant (CMA). During the course of documenting the history, physical exam and medical decision making, I was  functioning as a Stage manager. The creation of this record is the provider's dictation and/or activities during the visit.  Electronically signed by Richerd Panda, CMA 08/09/2023 12:20 PM    Richerd Panda, CMA  I agree the documentation is accurate and complete.  Electronically signed by: Lavanda Rumalda Glaze, MD 08/09/2023 1:02 PM        [1] No past surgical history on file. [2] Allergies Allergen Reactions  . Naproxen Hives and Shortness Of Breath  [3] No past medical history on file. [4] Family History Problem Relation Name Age of Onset  . Heart failure Father    . Kidney failure Father    . Diabetes Mother    [5]  Current Outpatient Medications:  .  fexofenadine (ALLEGRA) 180 mg tablet, Take 180 mg by mouth daily., Disp: , Rfl:  .  fluticasone-umeclidinium-vilanterol (TRELEGY ELLIPTA ) 200-62.5-25 mcg inhaler, Inhale 1 puff daily., Disp: , Rfl:  .  loratadine (CLARITIN) 10 mg tablet, Take 10 mg by mouth daily. AS DIRECTED, Disp: , Rfl:  .  metroNIDAZOLE (FLAGYL) 500 mg tablet, Take 1 tablet (500 mg total) by mouth 2 (two) times a day for 7 days., Disp: 14 tablet, Rfl: 0 .  montelukast  (SINGULAIR ) 10 mg tablet, Take 10 mg by mouth daily., Disp: , Rfl:

## 2023-08-09 NOTE — Progress Notes (Signed)
 Patient has been placed on the September recall list.

## 2023-09-10 ENCOUNTER — Ambulatory Visit: Admitting: Advanced Practice Midwife
